# Patient Record
Sex: Male | Born: 1965 | Race: White | Hispanic: No | Marital: Married | Smoking: Never smoker
Health system: Southern US, Community
[De-identification: ages and names within clinical notes are randomized; demographics above are authoritative.]

## PROBLEM LIST (undated history)

## (undated) DIAGNOSIS — M5412 Radiculopathy, cervical region: Secondary | ICD-10-CM

## (undated) DIAGNOSIS — N529 Male erectile dysfunction, unspecified: Secondary | ICD-10-CM

## (undated) DIAGNOSIS — I10 Essential (primary) hypertension: Secondary | ICD-10-CM

## (undated) DIAGNOSIS — M5000 Cervical disc disorder with myelopathy, unspecified cervical region: Secondary | ICD-10-CM

## (undated) DIAGNOSIS — M1990 Unspecified osteoarthritis, unspecified site: Secondary | ICD-10-CM

## (undated) DIAGNOSIS — N138 Other obstructive and reflux uropathy: Secondary | ICD-10-CM

## (undated) DIAGNOSIS — K222 Esophageal obstruction: Secondary | ICD-10-CM

## (undated) DIAGNOSIS — N401 Enlarged prostate with lower urinary tract symptoms: Secondary | ICD-10-CM

## (undated) DIAGNOSIS — N411 Chronic prostatitis: Secondary | ICD-10-CM

## (undated) DIAGNOSIS — R1310 Dysphagia, unspecified: Secondary | ICD-10-CM

## (undated) DIAGNOSIS — F419 Anxiety disorder, unspecified: Secondary | ICD-10-CM

## (undated) DIAGNOSIS — K219 Gastro-esophageal reflux disease without esophagitis: Secondary | ICD-10-CM

## (undated) DIAGNOSIS — U071 COVID-19: Secondary | ICD-10-CM

## (undated) HISTORY — DX: Anxiety disorder, unspecified: F41.9

## (undated) HISTORY — PX: APPENDECTOMY: SHX54

---

## 2006-09-02 LAB — HM COLONOSCOPY

## 2009-02-20 ENCOUNTER — Ambulatory Visit: Payer: Self-pay | Admitting: Unknown Physician Specialty

## 2010-11-26 ENCOUNTER — Ambulatory Visit: Payer: Self-pay | Admitting: Family Medicine

## 2012-09-23 DIAGNOSIS — N401 Enlarged prostate with lower urinary tract symptoms: Secondary | ICD-10-CM | POA: Insufficient documentation

## 2012-09-23 DIAGNOSIS — N411 Chronic prostatitis: Secondary | ICD-10-CM | POA: Insufficient documentation

## 2012-09-23 DIAGNOSIS — N508 Other specified disorders of male genital organs: Secondary | ICD-10-CM | POA: Insufficient documentation

## 2012-09-23 DIAGNOSIS — N529 Male erectile dysfunction, unspecified: Secondary | ICD-10-CM | POA: Insufficient documentation

## 2012-09-23 DIAGNOSIS — D487 Neoplasm of uncertain behavior of other specified sites: Secondary | ICD-10-CM | POA: Insufficient documentation

## 2013-07-26 ENCOUNTER — Ambulatory Visit: Payer: Self-pay | Admitting: Family Medicine

## 2013-09-26 ENCOUNTER — Ambulatory Visit: Payer: Self-pay | Admitting: Surgery

## 2013-09-29 LAB — PATHOLOGY REPORT

## 2014-07-25 NOTE — Op Note (Signed)
PATIENT NAME:  Johnny Liu, Johnny Liu MR#:  [id number] DATE OF BIRTH:  01-06-1966  DATE OF PROCEDURE:  09/26/2013  PREOPERATIVE DIAGNOSIS: Left axillary sebaceous cyst.  POSTOPERATIVE DIAGNOSIS: Left axillary sebaceous cyst, 31 mm.   OPERATION PERFORMED: Excision of left axillary sebaceous cyst.   SURGEON: Molly Maduro, MD  ANESTHESIA: General.   PROCEDURE IN DETAIL: The patient was placed supine on the operating room table and prepped and draped in the usual sterile fashion. An elliptical incision was made around the central pore to include the skin and this was carried down through the subcutaneous tissue to the cyst wall where the skin and cyst were excised en bloc. The excision was performed sharply and hemostasis was then achieved with electrocautery. The dead space was closed by interrupted 3-0 Monocryl sutures, that were also placed to a subdermal layer, and then the skin was reapproximated with a running subcuticular 5-0 Monocryl and Dermabond. The patient tolerated the procedure well. There were no complications.  ____________________________ Consuela Mimes, MD [id number] D: 09/26/2013 10:46:49 ET T: 09/26/2013 11:11:58 ET JOB#: [id number]  cc: Consuela Mimes, MD, <Dictator> Consuela Mimes MD ELECTRONICALLY SIGNED 09/27/2013 12:35

## 2015-03-17 ENCOUNTER — Encounter: Payer: Self-pay | Admitting: Family Medicine

## 2015-03-17 ENCOUNTER — Ambulatory Visit (INDEPENDENT_AMBULATORY_CARE_PROVIDER_SITE_OTHER): Payer: BLUE CROSS/BLUE SHIELD | Admitting: Family Medicine

## 2015-03-17 VITALS — BP 120/80 | HR 80 | Ht 68.0 in | Wt 226.0 lb

## 2015-03-17 DIAGNOSIS — M7711 Lateral epicondylitis, right elbow: Secondary | ICD-10-CM

## 2015-03-17 DIAGNOSIS — M7712 Lateral epicondylitis, left elbow: Secondary | ICD-10-CM | POA: Diagnosis not present

## 2015-03-17 DIAGNOSIS — F419 Anxiety disorder, unspecified: Secondary | ICD-10-CM

## 2015-03-17 DIAGNOSIS — Z0000 Encounter for general adult medical examination without abnormal findings: Secondary | ICD-10-CM

## 2015-03-17 DIAGNOSIS — L821 Other seborrheic keratosis: Secondary | ICD-10-CM | POA: Diagnosis not present

## 2015-03-17 MED ORDER — ALPRAZOLAM 0.25 MG PO TABS: 0.2500 mg | ORAL_TABLET | Freq: Two times a day (BID) | ORAL | Status: DC | PRN

## 2015-03-17 NOTE — Patient Instructions (Signed)
Lateral Epicondylitis With Rehab Lateral epicondylitis involves inflammation and pain around the outer portion of the elbow. The pain is caused by inflammation of the tendons in the forearm that bring back (extend) the wrist. Lateral epicondylitis is also called tennis elbow, because it is very common in tennis players. However, it may occur in any individual who extends the wrist repetitively. If lateral epicondylitis is left untreated, it may become a chronic problem. SYMPTOMS   Pain, tenderness, and inflammation on the outer (lateral) side of the elbow.  Pain or weakness with gripping activities.  Pain that increases with wrist-twisting motions (playing tennis, using a screwdriver, opening a door or a jar).  Pain with lifting objects, including a coffee cup. CAUSES  Lateral epicondylitis is caused by inflammation of the tendons that extend the wrist. Causes of injury may include:  Repetitive stress and strain on the muscles and tendons that extend the wrist.  Sudden change in activity level or intensity.  Incorrect grip in racquet sports.  Incorrect grip size of racquet (often too large).  Incorrect hitting position or technique (usually backhand, leading with the elbow).  Using a racket that is too heavy. RISK INCREASES WITH:  Sports or occupations that require repetitive and/or strenuous forearm and wrist movements (tennis, squash, racquetball, carpentry).  Poor wrist and forearm strength and flexibility.  Failure to warm up properly before activity.  Resuming activity before healing, rehabilitation, and conditioning are complete. PREVENTION   Warm up and stretch properly before activity.  Maintain physical fitness:  Strength, flexibility, and endurance.  Cardiovascular fitness.  Wear and use properly fitted equipment.  Learn and use proper technique and have a coach correct improper technique.  Wear a tennis elbow (counterforce) brace. PROGNOSIS  The course of  this condition depends on the degree of the injury. If treated properly, acute cases (symptoms lasting less than 4 weeks) are often resolved in 2 to 6 weeks. Chronic (longer lasting cases) often resolve in 3 to 6 months but may require physical therapy. RELATED COMPLICATIONS   Frequently recurring symptoms, resulting in a chronic problem. Properly treating the problem the first time decreases frequency of recurrence.  Chronic inflammation, scarring tendon degeneration, and partial tendon tear, requiring surgery.  Delayed healing or resolution of symptoms. TREATMENT  Treatment first involves the use of ice and medicine to reduce pain and inflammation. Strengthening and stretching exercises may help reduce discomfort if performed regularly. These exercises may be performed at home if the condition is an acute injury. Chronic cases may require a referral to a physical therapist for evaluation and treatment. Your caregiver may advise a corticosteroid injection to help reduce inflammation. Rarely, surgery is needed. MEDICATION  If pain medicine is needed, nonsteroidal anti-inflammatory medicines (aspirin and ibuprofen), or other minor pain relievers (acetaminophen), are often advised.  Do not take pain medicine for 7 days before surgery.  Prescription pain relievers may be given, if your caregiver thinks they are needed. Use only as directed and only as much as you need.  Corticosteroid injections may be recommended. These injections should be reserved only for the most severe cases, because they can only be given a certain number of times. HEAT AND COLD  Cold treatment (icing) should be applied for 10 to 15 minutes every 2 to 3 hours for inflammation and pain, and immediately after activity that aggravates your symptoms. Use ice packs or an ice massage.  Heat treatment may be used before performing stretching and strengthening activities prescribed by your caregiver, physical therapist,   or  athletic trainer. Use a heat pack or a warm water soak. SEEK MEDICAL CARE IF: Symptoms get worse or do not improve in 2 weeks, despite treatment. EXERCISES  RANGE OF MOTION (ROM) AND STRETCHING EXERCISES - Epicondylitis, Lateral (Tennis Elbow) These exercises may help you when beginning to rehabilitate your injury. Your symptoms may go away with or without further involvement from your physician, physical therapist, or athletic trainer. While completing these exercises, remember:   Restoring tissue flexibility helps normal motion to return to the joints. This allows healthier, less painful movement and activity.  An effective stretch should be held for at least 30 seconds.  A stretch should never be painful. You should only feel a gentle lengthening or release in the stretched tissue. RANGE OF MOTION - Wrist Flexion, Active-Assisted  Extend your right / left elbow with your fingers pointing down.*  Gently pull the back of your hand towards you, until you feel a gentle stretch on the top of your forearm.  Hold this position for __________ seconds. Repeat __________ times. Complete this exercise __________ times per day.  *If directed by your physician, physical therapist or athletic trainer, complete this stretch with your elbow bent, rather than extended. RANGE OF MOTION - Wrist Extension, Active-Assisted  Extend your right / left elbow and turn your palm upwards.*  Gently pull your palm and fingertips back, so your wrist extends and your fingers point more toward the ground.  You should feel a gentle stretch on the inside of your forearm.  Hold this position for __________ seconds. Repeat __________ times. Complete this exercise __________ times per day. *If directed by your physician, physical therapist or athletic trainer, complete this stretch with your elbow bent, rather than extended. STRETCH - Wrist Flexion  Place the back of your right / left hand on a tabletop, leaving your  elbow slightly bent. Your fingers should point away from your body.  Gently press the back of your hand down onto the table by straightening your elbow. You should feel a stretch on the top of your forearm.  Hold this position for __________ seconds. Repeat __________ times. Complete this stretch __________ times per day.  STRETCH - Wrist Extension   Place your right / left fingertips on a tabletop, leaving your elbow slightly bent. Your fingers should point backwards.  Gently press your fingers and palm down onto the table by straightening your elbow. You should feel a stretch on the inside of your forearm.  Hold this position for __________ seconds. Repeat __________ times. Complete this stretch __________ times per day.  STRENGTHENING EXERCISES - Epicondylitis, Lateral (Tennis Elbow) These exercises may help you when beginning to rehabilitate your injury. They may resolve your symptoms with or without further involvement from your physician, physical therapist, or athletic trainer. While completing these exercises, remember:   Muscles can gain both the endurance and the strength needed for everyday activities through controlled exercises.  Complete these exercises as instructed by your physician, physical therapist or athletic trainer. Increase the resistance and repetitions only as guided.  You may experience muscle soreness or fatigue, but the pain or discomfort you are trying to eliminate should never worsen during these exercises. If this pain does get worse, stop and make sure you are following the directions exactly. If the pain is still present after adjustments, discontinue the exercise until you can discuss the trouble with your caregiver. STRENGTH - Wrist Flexors  Sit with your right / left forearm palm-up and fully supported   on a table or countertop. Your elbow should be resting below the height of your shoulder. Allow your wrist to extend over the edge of the  surface.  Loosely holding a __________ weight, or a piece of rubber exercise band or tubing, slowly curl your hand up toward your forearm.  Hold this position for __________ seconds. Slowly lower the wrist back to the starting position in a controlled manner. Repeat __________ times. Complete this exercise __________ times per day.  STRENGTH - Wrist Extensors  Sit with your right / left forearm palm-down and fully supported on a table or countertop. Your elbow should be resting below the height of your shoulder. Allow your wrist to extend over the edge of the surface.  Loosely holding a __________ weight, or a piece of rubber exercise band or tubing, slowly curl your hand up toward your forearm.  Hold this position for __________ seconds. Slowly lower the wrist back to the starting position in a controlled manner. Repeat __________ times. Complete this exercise __________ times per day.  STRENGTH - Ulnar Deviators  Stand with a ____________________ weight in your right / left hand, or sit while holding a rubber exercise band or tubing, with your healthy arm supported on a table or countertop.  Move your wrist, so that your pinkie travels toward your forearm and your thumb moves away from your forearm.  Hold this position for __________ seconds and then slowly lower the wrist back to the starting position. Repeat __________ times. Complete this exercise __________ times per day STRENGTH - Radial Deviators  Stand with a ____________________ weight in your right / left hand, or sit while holding a rubber exercise band or tubing, with your injured arm supported on a table or countertop.  Raise your hand upward in front of you or pull up on the rubber tubing.  Hold this position for __________ seconds and then slowly lower the wrist back to the starting position. Repeat __________ times. Complete this exercise __________ times per day. STRENGTH - Forearm Supinators   Sit with your right /  left forearm supported on a table, keeping your elbow below shoulder height. Rest your hand over the edge, palm down.  Gently grip a hammer or a soup ladle.  Without moving your elbow, slowly turn your palm and hand upward to a "thumbs-up" position.  Hold this position for __________ seconds. Slowly return to the starting position. Repeat __________ times. Complete this exercise __________ times per day.  STRENGTH - Forearm Pronators   Sit with your right / left forearm supported on a table, keeping your elbow below shoulder height. Rest your hand over the edge, palm up.  Gently grip a hammer or a soup ladle.  Without moving your elbow, slowly turn your palm and hand upward to a "thumbs-up" position.  Hold this position for __________ seconds. Slowly return to the starting position. Repeat __________ times. Complete this exercise __________ times per day.  STRENGTH - Grip  Grasp a tennis ball, a dense sponge, or a large, rolled sock in your hand.  Squeeze as hard as you can, without increasing any pain.  Hold this position for __________ seconds. Release your grip slowly. Repeat __________ times. Complete this exercise __________ times per day.  STRENGTH - Elbow Extensors, Isometric  Stand or sit upright, on a firm surface. Place your right / left arm so that your palm faces your stomach, and it is at the height of your waist.  Place your opposite hand on the underside   of your forearm. Gently push up as your right / left arm resists. Push as hard as you can with both arms, without causing any pain or movement at your right / left elbow. Hold this stationary position for __________ seconds. Gradually release the tension in both arms. Allow your muscles to relax completely before repeating.   This information is not intended to replace advice given to you by your health care provider. Make sure you discuss any questions you have with your health care provider.   Document Released:  03/20/2005 Document Revised: 04/10/2014 Document Reviewed: 07/02/2008 Elsevier Interactive Patient Education 2016 [place] Maintenance, Male A healthy lifestyle and preventative care can promote health and wellness.  Maintain regular health, dental, and eye exams.  Eat a healthy diet. Foods like vegetables, fruits, whole grains, low-fat dairy products, and lean protein foods contain the nutrients you need and are low in calories. Decrease your intake of foods high in solid fats, added sugars, and salt. Get information about a proper diet from your health care provider, if necessary.  Regular physical exercise is one of the most important things you can do for your health. Most adults should get at least 150 minutes of moderate-intensity exercise (any activity that increases your heart rate and causes you to sweat) each week. In addition, most adults need muscle-strengthening exercises on 2 or more days a week.   Maintain a healthy weight. The body mass index (BMI) is a screening tool to identify possible weight problems. It provides an estimate of body fat based on height and weight. Your health care provider can find your BMI and can help you achieve or maintain a healthy weight. For males 20 years and older:  A BMI below 18.5 is considered underweight.  A BMI of 18.5 to 24.9 is normal.  A BMI of 25 to 29.9 is considered overweight.  A BMI of 30 and above is considered obese.  Maintain normal blood lipids and cholesterol by exercising and minimizing your intake of saturated fat. Eat a balanced diet with plenty of fruits and vegetables. Blood tests for lipids and cholesterol should begin at age 32 and be repeated every 5 years. If your lipid or cholesterol levels are high, you are over age 76, or you are at high risk for heart disease, you may need your cholesterol levels checked more frequently.Ongoing high lipid and cholesterol levels should be treated with medicines if diet and  exercise are not working.  If you smoke, find out from your health care provider how to quit. If you do not use tobacco, do not start.  Lung cancer screening is recommended for adults aged 53-80 years who are at high risk for developing lung cancer because of a history of smoking. A yearly low-dose CT scan of the lungs is recommended for people who have at least a 30-pack-year history of smoking and are current smokers or have quit within the past 15 years. A pack year of smoking is smoking an average of 1 pack of cigarettes a day for 1 year (for example, a 30-pack-year history of smoking could mean smoking 1 pack a day for 30 years or 2 packs a day for 15 years). Yearly screening should continue until the smoker has stopped smoking for at least 15 years. Yearly screening should be stopped for people who develop a health problem that would prevent them from having lung cancer treatment.  If you choose to drink alcohol, do not have more than 2 drinks per  day. One drink is considered to be 12 oz (360 mL) of beer, 5 oz (150 mL) of wine, or 1.5 oz (45 mL) of liquor.  Avoid the use of street drugs. Do not share needles with anyone. Ask for help if you need support or instructions about stopping the use of drugs.  High blood pressure causes heart disease and increases the risk of stroke. High blood pressure is more likely to develop in:  People who have blood pressure in the end of the normal range (100-139/85-89 mm Hg).  People who are overweight or obese.  People who are African American.  If you are 3-26 years of age, have your blood pressure checked every 3-5 years. If you are 40 years of age or older, have your blood pressure checked every year. You should have your blood pressure measured twice--once when you are at a hospital or clinic, and once when you are not at a hospital or clinic. Record the average of the two measurements. To check your blood pressure when you are not at a hospital or  clinic, you can use:  An automated blood pressure machine at a pharmacy.  A home blood pressure monitor.  If you are 40-32 years old, ask your health care provider if you should take aspirin to prevent heart disease.  Diabetes screening involves taking a blood sample to check your fasting blood sugar level. This should be done once every 3 years after age 38 if you are at a normal weight and without risk factors for diabetes. Testing should be considered at a younger age or be carried out more frequently if you are overweight and have at least 1 risk factor for diabetes.  Colorectal cancer can be detected and often prevented. Most routine colorectal cancer screening begins at the age of 23 and continues through age 29. However, your health care provider may recommend screening at an earlier age if you have risk factors for colon cancer. On a yearly basis, your health care provider may provide home test kits to check for hidden blood in the stool. A small camera at the end of a tube may be used to directly examine the colon (sigmoidoscopy or colonoscopy) to detect the earliest forms of colorectal cancer. Talk to your health care provider about this at age 63 when routine screening begins. A direct exam of the colon should be repeated every 5-10 years through age 53, unless early forms of precancerous polyps or small growths are found.  People who are at an increased risk for hepatitis B should be screened for this virus. You are considered at high risk for hepatitis B if:  You were born in a country where hepatitis B occurs often. Talk with your health care provider about which countries are considered high risk.  Your parents were born in a high-risk country and you have not received a shot to protect against hepatitis B (hepatitis B vaccine).  You have HIV or AIDS.  You use needles to inject street drugs.  You live with, or have sex with, someone who has hepatitis B.  You are a man who has  sex with other men (MSM).  You get hemodialysis treatment.  You take certain medicines for conditions like cancer, organ transplantation, and autoimmune conditions.  Hepatitis C blood testing is recommended for all people born from [birth year] through 1966 and any individual with known risk factors for hepatitis C.  Healthy men should no longer receive prostate-specific antigen (PSA) blood tests as part  of routine cancer screening. Talk to your health care provider about prostate cancer screening.  Testicular cancer screening is not recommended for adolescents or adult males who have no symptoms. Screening includes self-exam, a health care provider exam, and other screening tests. Consult with your health care provider about any symptoms you have or any concerns you have about testicular cancer.  Practice safe sex. Use condoms and avoid high-risk sexual practices to reduce the spread of sexually transmitted infections (STIs).  You should be screened for STIs, including gonorrhea and chlamydia if:  You are sexually active and are younger than 24 years.  You are older than 24 years, and your health care provider tells you that you are at risk for this type of infection.  Your sexual activity has changed since you were last screened, and you are at an increased risk for chlamydia or gonorrhea. Ask your health care provider if you are at risk.  If you are at risk of being infected with HIV, it is recommended that you take a prescription medicine daily to prevent HIV infection. This is called pre-exposure prophylaxis (PrEP). You are considered at risk if:  You are a man who has sex with other men (MSM).  You are a heterosexual man who is sexually active with multiple partners.  You take drugs by injection.  You are sexually active with a partner who has HIV.  Talk with your health care provider about whether you are at high risk of being infected with HIV. If you choose to begin PrEP, you should  first be tested for HIV. You should then be tested every 3 months for as long as you are taking PrEP.  Use sunscreen. Apply sunscreen liberally and repeatedly throughout the day. You should seek shade when your shadow is shorter than you. Protect yourself by wearing long sleeves, pants, a wide-brimmed hat, and sunglasses year round whenever you are outdoors.  Tell your health care provider of new moles or changes in moles, especially if there is a change in shape or color. Also, tell your health care provider if a mole is larger than the size of a pencil eraser.  A one-time screening for abdominal aortic aneurysm (AAA) and surgical repair of large AAAs by ultrasound is recommended for men aged 37-75 years who are current or former smokers.  Stay current with your vaccines (immunizations).   This information is not intended to replace advice given to you by your health care provider. Make sure you discuss any questions you have with your health care provider.   Document Released: 09/16/2007 Document Revised: 04/10/2014 Document Reviewed: 08/15/2010 Elsevier Interactive Patient Education [employer].

## 2015-03-17 NOTE — Progress Notes (Signed)
Name: Johnny Liu   MRN: [id number]    DOB: 04-09-1965   Date:03/17/2015       Progress Note  Subjective  Chief Complaint  Chief Complaint  Patient presents with  . Establish Care    Anxiety Presents for follow-up visit. Symptoms include nervous/anxious behavior. Patient reports no chest pain, compulsions, confusion, decreased concentration, depressed mood, dizziness, dry mouth, excessive worry, feeling of choking, hyperventilation, impotence, insomnia, irritability, malaise, muscle tension, nausea, obsessions, palpitations, panic, restlessness, shortness of breath or suicidal ideas. Symptoms occur occasionally. The severity of symptoms is mild. The symptoms are aggravated by work stress and family issues. The quality of sleep is good.   There is no history of anemia, anxiety/panic attacks, arrhythmia, asthma, bipolar disorder, CAD, CHF, chronic lung disease, depression, fibromyalgia, hyperthyroidism or suicide attempts. Past treatments include benzodiazephines. Compliance with prior treatments has been good.    No problem-specific assessment & plan notes found for this encounter.   No past medical history on file.  Past Surgical History  Procedure Laterality Date  . Appendectomy      No family history on file.  Social History   Social History  . Marital Status: Married    Spouse Name: N/A  . Number of Children: N/A  . Years of Education: N/A   Occupational History  . Not on file.   Social History Main Topics  . Smoking status: Never Smoker   . Smokeless tobacco: Not on file  . Alcohol Use: 0.0 oz/week    0 Standard drinks or equivalent per week  . Drug Use: No  . Sexual Activity: Not on file   Other Topics Concern  . Not on file   Social History Narrative  . No narrative on file    No Known Allergies   Review of Systems  Constitutional: Negative for fever, chills, weight loss, malaise/fatigue and irritability.  HENT: Negative for ear discharge, ear pain  and sore throat.   Eyes: Negative for blurred vision.  Respiratory: Negative for cough, sputum production, shortness of breath and wheezing.   Cardiovascular: Negative for chest pain, palpitations and leg swelling.  Gastrointestinal: Negative for heartburn, nausea, abdominal pain, diarrhea, constipation, blood in stool and melena.  Genitourinary: Negative for dysuria, urgency, frequency, hematuria and impotence.  Musculoskeletal: Negative for myalgias, back pain, joint pain and neck pain.  Skin: Negative for rash.  Neurological: Negative for dizziness, tingling, sensory change, focal weakness and headaches.  Endo/Heme/Allergies: Negative for environmental allergies and polydipsia. Does not bruise/bleed easily.  Psychiatric/Behavioral: Negative for depression, suicidal ideas, confusion and decreased concentration. The patient is nervous/anxious. The patient does not have insomnia.      Objective  Filed Vitals:   03/17/15 1019  BP: 120/80  Pulse: 80  Height: 5\' 8"  (1.727 m)  Weight: 226 lb (102.513 kg)    Physical Exam  Constitutional: He is oriented to person, place, and time and well-developed, well-nourished, and in no distress.  HENT:  Head: Normocephalic.  Right Ear: External ear normal.  Left Ear: External ear normal.  Nose: Nose normal.  Mouth/Throat: Oropharynx is clear and moist.  Eyes: Conjunctivae and EOM are normal. Pupils are equal, round, and reactive to light. Right eye exhibits no discharge. Left eye exhibits no discharge. No scleral icterus.  Neck: Normal range of motion. Neck supple. No JVD present. No tracheal deviation present. No thyromegaly present.  Cardiovascular: Normal rate, regular rhythm, normal heart sounds and intact distal pulses.  Exam reveals no gallop and no friction rub.  No murmur heard. Pulmonary/Chest: Breath sounds normal. No respiratory distress. He has no wheezes. He has no rales.  Abdominal: Soft. Bowel sounds are normal. He exhibits no  mass. There is no hepatosplenomegaly. There is no tenderness. There is no rebound, no guarding and no CVA tenderness.  Musculoskeletal: Normal range of motion. He exhibits no edema or tenderness.  Lymphadenopathy:    He has no cervical adenopathy.  Neurological: He is alert and oriented to person, place, and time. He has normal sensation, normal strength and intact cranial nerves. No cranial nerve deficit.  Skin: Skin is warm. No rash noted.  Psychiatric: Mood and affect normal.  Nursing note and vitals reviewed.     Assessment & Plan  Problem List Items Addressed This Visit    None    Visit Diagnoses    Routine adult health maintenance    -  Primary    Relevant Orders    Lipid Profile    Renal Function Panel    Chronic anxiety        Relevant Medications    ALPRAZolam (XANAX) 0.25 MG tablet    Lateral epicondylitis of both elbows        Relevant Orders    Ambulatory referral to Gastroenterology    Seborrheic keratosis        Relevant Orders    Ambulatory referral to Dermatology         Dr. Otilio Miu [facility]  03/17/2015

## 2015-03-18 LAB — RENAL FUNCTION PANEL
ALBUMIN: 5.1 g/dL (ref 3.5–5.5)
BUN/Creatinine Ratio: 16 (ref 9–20)
BUN: 15 mg/dL (ref 6–24)
CHLORIDE: 98 mmol/L (ref 96–106)
CO2: 25 mmol/L (ref 18–29)
Calcium: 9.3 mg/dL (ref 8.7–10.2)
Creatinine, Ser: 0.93 mg/dL (ref 0.76–1.27)
GFR calc Af Amer: 110 mL/min/{1.73_m2} (ref >59)
GFR, EST NON AFRICAN AMERICAN: 95 mL/min/{1.73_m2} (ref >59)
Glucose: 84 mg/dL (ref 65–99)
PHOSPHORUS: 3.6 mg/dL (ref 2.5–4.5)
Potassium: 4.3 mmol/L (ref 3.5–5.2)
SODIUM: 139 mmol/L (ref 134–144)

## 2015-03-18 LAB — LIPID PANEL
Chol/HDL Ratio: 2.8 ratio units (ref 0.0–5.0)
Cholesterol, Total: 170 mg/dL (ref 100–199)
HDL: 60 mg/dL (ref >39)
LDL Calculated: 95 mg/dL (ref 0–99)
Triglycerides: 75 mg/dL (ref 0–149)
VLDL Cholesterol Cal: 15 mg/dL (ref 5–40)

## 2015-07-12 ENCOUNTER — Ambulatory Visit (INDEPENDENT_AMBULATORY_CARE_PROVIDER_SITE_OTHER): Payer: BLUE CROSS/BLUE SHIELD | Admitting: Family Medicine

## 2015-07-12 ENCOUNTER — Encounter: Payer: Self-pay | Admitting: Family Medicine

## 2015-07-12 VITALS — BP 120/80 | HR 78 | Ht 66.0 in | Wt 230.0 lb

## 2015-07-12 DIAGNOSIS — J40 Bronchitis, not specified as acute or chronic: Secondary | ICD-10-CM

## 2015-07-12 DIAGNOSIS — F419 Anxiety disorder, unspecified: Secondary | ICD-10-CM

## 2015-07-12 DIAGNOSIS — J0100 Acute maxillary sinusitis, unspecified: Secondary | ICD-10-CM

## 2015-07-12 MED ORDER — ALPRAZOLAM 0.25 MG PO TABS: 0.2500 mg | ORAL_TABLET | Freq: Two times a day (BID) | ORAL | Status: DC | PRN

## 2015-07-12 MED ORDER — AZITHROMYCIN 250 MG PO TABS: ORAL_TABLET | ORAL | Status: DC

## 2015-07-12 MED ORDER — GUAIFENESIN-CODEINE 100-10 MG/5ML PO SYRP: 5.0000 mL | ORAL_SOLUTION | Freq: Three times a day (TID) | ORAL | Status: DC | PRN

## 2015-07-12 NOTE — Progress Notes (Signed)
Name: Johnny Liu   MRN: [id number]    DOB: 16-Nov-1965   Date:07/12/2015       Progress Note  Subjective  Chief Complaint  Chief Complaint  Patient presents with  . Sinusitis    cough, head cong- "not that bad, don't want it to get worse"  . Anxiety    Sinusitis This is a new problem. The current episode started in the past 7 days. The problem has been waxing and waning since onset. There has been no fever. The pain is mild. Associated symptoms include congestion, coughing, shortness of breath, sinus pressure and a sore throat. Pertinent negatives include no chills, ear pain, headaches, hoarse voice, neck pain, sneezing or swollen glands. (Productive cough) Past treatments include oral decongestants. The treatment provided mild relief.  Anxiety Presents for follow-up visit. Symptoms include shortness of breath. Patient reports no chest pain, compulsions, confusion, decreased concentration, depressed mood, dizziness, dry mouth, excessive worry, feeling of choking, hyperventilation, impotence, insomnia, irritability, malaise, muscle tension, nausea, nervous/anxious behavior, obsessions, palpitations, panic, restlessness or suicidal ideas. Symptoms occur occasionally. The symptoms are aggravated by work stress. The quality of sleep is poor.      No problem-specific assessment & plan notes found for this encounter.   History reviewed. No pertinent past medical history.  Past Surgical History  Procedure Laterality Date  . Appendectomy      History reviewed. No pertinent family history.  Social History   Social History  . Marital Status: Married    Spouse Name: N/A  . Number of Children: N/A  . Years of Education: N/A   Occupational History  . Not on file.   Social History Main Topics  . Smoking status: Never Smoker   . Smokeless tobacco: Not on file  . Alcohol Use: 0.0 oz/week    0 Standard drinks or equivalent per week  . Drug Use: No  . Sexual Activity: Not on file    Other Topics Concern  . Not on file   Social History Narrative    No Known Allergies   Review of Systems  Constitutional: Negative for fever, chills, weight loss, malaise/fatigue and irritability.  HENT: Positive for congestion, sinus pressure and sore throat. Negative for ear discharge, ear pain, hoarse voice and sneezing.   Eyes: Negative for blurred vision.  Respiratory: Positive for cough and shortness of breath. Negative for sputum production and wheezing.   Cardiovascular: Negative for chest pain, palpitations and leg swelling.  Gastrointestinal: Negative for heartburn, nausea, abdominal pain, diarrhea, constipation, blood in stool and melena.  Genitourinary: Negative for dysuria, urgency, frequency, hematuria and impotence.  Musculoskeletal: Negative for myalgias, back pain, joint pain and neck pain.  Skin: Negative for rash.  Neurological: Negative for dizziness, tingling, sensory change, focal weakness and headaches.  Endo/Heme/Allergies: Negative for environmental allergies and polydipsia. Does not bruise/bleed easily.  Psychiatric/Behavioral: Negative for depression, suicidal ideas, confusion and decreased concentration. The patient is not nervous/anxious and does not have insomnia.      Objective  Filed Vitals:   07/12/15 1050  BP: 120/80  Pulse: 78  Height: 5\' 6"  (1.676 m)  Weight: 230 lb (104.327 kg)    Physical Exam  Constitutional: He is oriented to person, place, and time and well-developed, well-nourished, and in no distress.  HENT:  Head: Normocephalic.  Right Ear: Tympanic membrane, external ear and ear canal normal.  Left Ear: Tympanic membrane, external ear and ear canal normal.  Nose: Nose normal.  Mouth/Throat: Mucous membranes are normal. Posterior  oropharyngeal erythema present.  Eyes: Conjunctivae and EOM are normal. Pupils are equal, round, and reactive to light. Right eye exhibits no discharge. Left eye exhibits no discharge. No scleral  icterus.  Neck: Normal range of motion. Neck supple. No JVD present. No tracheal deviation present. No thyromegaly present.  Cardiovascular: Normal rate, regular rhythm, normal heart sounds and intact distal pulses.  Exam reveals no gallop and no friction rub.   No murmur heard. Pulmonary/Chest: Breath sounds normal. No respiratory distress. He has no wheezes. He has no rales.  Abdominal: Soft. Bowel sounds are normal. He exhibits no mass. There is no hepatosplenomegaly. There is no tenderness. There is no rebound, no guarding and no CVA tenderness.  Musculoskeletal: Normal range of motion. He exhibits no edema or tenderness.  Lymphadenopathy:    He has no cervical adenopathy.  Neurological: He is alert and oriented to person, place, and time. He has normal sensation, normal strength and intact cranial nerves. No cranial nerve deficit.  Skin: Skin is warm. No rash noted.  Psychiatric: Mood and affect normal.  Nursing note and vitals reviewed.     Assessment & Plan  Problem List Items Addressed This Visit    None    Visit Diagnoses    Chronic anxiety    -  Primary    Relevant Medications    ALPRAZolam (XANAX) 0.25 MG tablet    Acute maxillary sinusitis, recurrence not specified        Relevant Medications    azithromycin (ZITHROMAX) 250 MG tablet    guaiFENesin-codeine (ROBITUSSIN AC) 100-10 MG/5ML syrup    Bronchitis        Relevant Medications    guaiFENesin-codeine (ROBITUSSIN AC) 100-10 MG/5ML syrup         Dr. Laverne Hursey [facility]  07/12/2015

## 2015-12-21 ENCOUNTER — Encounter: Payer: Self-pay | Admitting: Family Medicine

## 2015-12-21 ENCOUNTER — Other Ambulatory Visit: Payer: Self-pay

## 2015-12-21 ENCOUNTER — Ambulatory Visit (INDEPENDENT_AMBULATORY_CARE_PROVIDER_SITE_OTHER): Payer: BLUE CROSS/BLUE SHIELD | Admitting: Family Medicine

## 2015-12-21 VITALS — BP 130/80 | HR 72 | Ht 68.0 in | Wt 230.0 lb

## 2015-12-21 DIAGNOSIS — M7711 Lateral epicondylitis, right elbow: Secondary | ICD-10-CM | POA: Diagnosis not present

## 2015-12-21 DIAGNOSIS — M7712 Lateral epicondylitis, left elbow: Secondary | ICD-10-CM

## 2015-12-21 DIAGNOSIS — F419 Anxiety disorder, unspecified: Secondary | ICD-10-CM

## 2015-12-21 DIAGNOSIS — Z0000 Encounter for general adult medical examination without abnormal findings: Secondary | ICD-10-CM | POA: Diagnosis not present

## 2015-12-21 LAB — POCT URINALYSIS DIPSTICK
Bilirubin, UA: NEGATIVE
Blood, UA: NEGATIVE
GLUCOSE UA: NEGATIVE
Ketones, UA: NEGATIVE
LEUKOCYTES UA: NEGATIVE
Nitrite, UA: NEGATIVE
PROTEIN UA: NEGATIVE
Spec Grav, UA: <=1.005
UROBILINOGEN UA: 0.2
pH, UA: 6

## 2015-12-21 LAB — HEMOCCULT GUIAC POC 1CARD (OFFICE): FECAL OCCULT BLD: POSITIVE — AB

## 2015-12-21 MED ORDER — ETODOLAC 500 MG PO TABS: 500.0000 mg | ORAL_TABLET | Freq: Two times a day (BID) | ORAL | 3 refills | Status: DC

## 2015-12-21 MED ORDER — BUSPIRONE HCL 7.5 MG PO TABS: 7.5000 mg | ORAL_TABLET | Freq: Two times a day (BID) | ORAL | 2 refills | Status: DC

## 2015-12-21 NOTE — Progress Notes (Signed)
Name: Johnny Liu   MRN: [id number]    DOB: 1966/03/10   Date:12/21/2015       Progress Note  Subjective  Chief Complaint  Chief Complaint  Patient presents with  . Annual Exam    work physical  . Arm Pain    bilateral elbow pain  . Depression    starting a medication for long term use    Patient presents for annual physical exam.   Arm Pain   The incident occurred more than 1 week ago. The injury mechanism was repetitive motion. The pain is present in the left elbow and right elbow. The quality of the pain is described as aching. The pain does not radiate. The pain is at a severity of 5/10. The pain is mild. The pain has been fluctuating since the incident. Pertinent negatives include no chest pain, muscle weakness, numbness or tingling. Nothing aggravates the symptoms.  Depression         This is a new problem.  The current episode started more than 1 month ago.   The onset quality is gradual.   Associated symptoms include no decreased concentration, no fatigue, no helplessness, no hopelessness, does not have insomnia, not irritable, no restlessness, no decreased interest, no appetite change, no body aches, no myalgias, no headaches, no indigestion, not sad and no suicidal ideas.     The symptoms are aggravated by nothing.  Past treatments include nothing.  Previous treatment provided mild relief.   No problem-specific Assessment & Plan notes found for this encounter.   Past Medical History:  Diagnosis Date  . Anxiety     Past Surgical History:  Procedure Laterality Date  . APPENDECTOMY      History reviewed. No pertinent family history.  Social History   Social History  . Marital status: Married    Spouse name: N/A  . Number of children: N/A  . Years of education: N/A   Occupational History  . Not on file.   Social History Main Topics  . Smoking status: Never Smoker  . Smokeless tobacco: Not on file  . Alcohol use 0.0 oz/week  . Drug use: No  . Sexual  activity: Not on file   Other Topics Concern  . Not on file   Social History Narrative  . No narrative on file    No Known Allergies   Review of Systems  Constitutional: Negative for appetite change, chills, fatigue, fever, malaise/fatigue and weight loss.  HENT: Negative for ear discharge, ear pain and sore throat.   Eyes: Negative for blurred vision.  Respiratory: Negative for cough, sputum production, shortness of breath and wheezing.   Cardiovascular: Negative for chest pain, palpitations and leg swelling.  Gastrointestinal: Negative for abdominal pain, blood in stool, constipation, diarrhea, heartburn, melena and nausea.  Genitourinary: Negative for dysuria, frequency, hematuria and urgency.  Musculoskeletal: Negative for back pain, joint pain, myalgias and neck pain.  Skin: Negative for rash.  Neurological: Negative for dizziness, tingling, sensory change, focal weakness, numbness and headaches.  Endo/Heme/Allergies: Negative for environmental allergies and polydipsia. Does not bruise/bleed easily.  Psychiatric/Behavioral: Positive for depression. Negative for decreased concentration and suicidal ideas. The patient is not nervous/anxious and does not have insomnia.      Objective  Vitals:   12/21/15 1400  BP: 130/80  Pulse: 72  Weight: 230 lb (104.3 kg)  Height: 5\' 8"  (1.727 m)    Physical Exam  Constitutional: He is oriented to person, place, and time and well-developed, well-nourished,  and in no distress. He is not irritable.  HENT:  Head: Normocephalic.  Right Ear: Tympanic membrane, external ear and ear canal normal.  Left Ear: Tympanic membrane, external ear and ear canal normal.  Nose: Nose normal.  Mouth/Throat: Uvula is midline and oropharynx is clear and moist.  Eyes: Conjunctivae and EOM are normal. Pupils are equal, round, and reactive to light. Right eye exhibits no discharge. Left eye exhibits no discharge. No scleral icterus.  Fundoscopic exam:       The right eye shows no arteriolar narrowing, no AV nicking and no papilledema.       The left eye shows no arteriolar narrowing, no AV nicking and no papilledema.  Neck: Normal range of motion. Neck supple. Normal carotid pulses, no hepatojugular reflux and no JVD present. Carotid bruit is not present. No tracheal deviation present. No thyromegaly present.  Cardiovascular: Normal rate, regular rhythm, S1 normal, S2 normal, normal heart sounds and intact distal pulses.  Exam reveals no gallop, no S3, no S4 and no friction rub.   No murmur heard. Pulmonary/Chest: Breath sounds normal. No respiratory distress. He has no wheezes. He has no rales. Right breast exhibits no mass. Left breast exhibits no mass.  Abdominal: Soft. Bowel sounds are normal. He exhibits no mass. There is no hepatosplenomegaly. There is no tenderness. There is no rebound, no guarding and no CVA tenderness.  Genitourinary: Rectum normal, prostate normal and testes/scrotum normal.  Musculoskeletal: Normal range of motion. He exhibits no edema.       Right elbow: Tenderness found. Lateral epicondyle tenderness noted.       Left elbow: Tenderness found. Lateral epicondyle tenderness noted.  Lymphadenopathy:       Head (right side): No submental and no submandibular adenopathy present.       Head (left side): No submental and no submandibular adenopathy present.    He has no cervical adenopathy.    He has no axillary adenopathy.  Neurological: He is alert and oriented to person, place, and time. He has normal sensation, normal strength, normal reflexes and intact cranial nerves. No cranial nerve deficit.  Skin: Skin is warm, dry and intact. No rash noted. He is not diaphoretic. No pallor.  Psychiatric: Mood and affect normal.      Assessment & Plan  Problem List Items Addressed This Visit    None    Visit Diagnoses    Annual physical exam    -  Primary   Relevant Orders   Lipid Profile   Renal Function Panel   POCT  Occult Blood Stool (Completed)   POCT urinalysis dipstick (Completed)   Lateral epicondylitis of both elbows       Relevant Medications   etodolac (LODINE) 500 MG tablet   Acute anxiety       Relevant Medications   busPIRone (BUSPAR) 7.5 MG tablet        Dr. Deanna Jones [facility]  12/21/15

## 2015-12-22 LAB — RENAL FUNCTION PANEL
ALBUMIN: 5 g/dL (ref 3.5–5.5)
BUN / CREAT RATIO: 14 (ref 9–20)
BUN: 14 mg/dL (ref 6–24)
CALCIUM: 9.7 mg/dL (ref 8.7–10.2)
CHLORIDE: 96 mmol/L (ref 96–106)
CO2: 25 mmol/L (ref 18–29)
CREATININE: 0.97 mg/dL (ref 0.76–1.27)
GFR calc Af Amer: 105 mL/min/{1.73_m2} (ref >59)
GFR calc non Af Amer: 91 mL/min/{1.73_m2} (ref >59)
Glucose: 98 mg/dL (ref 65–99)
PHOSPHORUS: 4 mg/dL (ref 2.5–4.5)
Potassium: 4.3 mmol/L (ref 3.5–5.2)
Sodium: 140 mmol/L (ref 134–144)

## 2015-12-22 LAB — LIPID PANEL
CHOLESTEROL TOTAL: 168 mg/dL (ref 100–199)
Chol/HDL Ratio: 3.2 ratio units (ref 0.0–5.0)
HDL: 52 mg/dL (ref >39)
LDL CALC: 101 mg/dL — AB (ref 0–99)
TRIGLYCERIDES: 74 mg/dL (ref 0–149)
VLDL Cholesterol Cal: 15 mg/dL (ref 5–40)

## 2016-04-03 HISTORY — PX: POLYPECTOMY: SHX149

## 2016-05-26 ENCOUNTER — Other Ambulatory Visit: Payer: Self-pay

## 2016-05-26 ENCOUNTER — Ambulatory Visit (INDEPENDENT_AMBULATORY_CARE_PROVIDER_SITE_OTHER): Payer: BLUE CROSS/BLUE SHIELD | Admitting: Family Medicine

## 2016-05-26 VITALS — BP 110/70 | HR 80 | Ht 68.0 in | Wt 225.0 lb

## 2016-05-26 DIAGNOSIS — M5126 Other intervertebral disc displacement, lumbar region: Secondary | ICD-10-CM | POA: Diagnosis not present

## 2016-05-26 MED ORDER — TRAMADOL HCL 50 MG PO TABS: 50.0000 mg | ORAL_TABLET | Freq: Three times a day (TID) | ORAL | 0 refills | Status: DC | PRN

## 2016-05-26 MED ORDER — PREDNISONE 10 MG PO TABS: 10.0000 mg | ORAL_TABLET | Freq: Every day | ORAL | 0 refills | Status: DC

## 2016-05-26 MED ORDER — CYCLOBENZAPRINE HCL 10 MG PO TABS: 10.0000 mg | ORAL_TABLET | Freq: Three times a day (TID) | ORAL | 1 refills | Status: DC | PRN

## 2016-05-26 MED ORDER — ETODOLAC 500 MG PO TABS: 500.0000 mg | ORAL_TABLET | Freq: Two times a day (BID) | ORAL | 1 refills | Status: DC

## 2016-05-26 NOTE — Progress Notes (Signed)
Name: Johnny Liu   MRN: [id number]    DOB: 1966-03-17   Date:05/26/2016       Progress Note  Subjective  Chief Complaint  Chief Complaint  Patient presents with  . Back Pain    muscle spasms after running- wants Tramadol    Back Pain  This is a new problem. The current episode started in the past 7 days. The problem occurs intermittently. The problem has been waxing and waning since onset. The pain is present in the lumbar spine (across at the bottom). The quality of the pain is described as aching. The pain does not radiate. The pain is at a severity of 8/10. The pain is moderate. The symptoms are aggravated by bending, standing, twisting and sitting. Pertinent negatives include no abdominal pain, bladder incontinence, bowel incontinence, chest pain, dysuria, fever, headaches, leg pain, numbness, paresis, paresthesias, perianal numbness, tingling, weakness or weight loss. Risk factors include recent trauma (started running program). The treatment provided moderate relief.    No problem-specific Assessment & Plan notes found for this encounter.   Past Medical History:  Diagnosis Date  . Anxiety     Past Surgical History:  Procedure Laterality Date  . APPENDECTOMY      No family history on file.  Social History   Social History  . Marital status: Married    Spouse name: N/A  . Number of children: N/A  . Years of education: N/A   Occupational History  . Not on file.   Social History Main Topics  . Smoking status: Never Smoker  . Smokeless tobacco: Not on file  . Alcohol use 0.0 oz/week  . Drug use: No  . Sexual activity: Not on file   Other Topics Concern  . Not on file   Social History Narrative  . No narrative on file    No Known Allergies  Outpatient Medications Prior to Visit  Medication Sig Dispense Refill  . ALPRAZolam (XANAX) 0.25 MG tablet Take 1 tablet (0.25 mg total) by mouth 2 (two) times daily as needed for anxiety. Take one a day prn (Patient  taking differently: Take 0.25 mg by mouth at bedtime. Take one a day prn) 30 tablet 5  . busPIRone (BUSPAR) 7.5 MG tablet Take 1 tablet (7.5 mg total) by mouth 2 (two) times daily. (Patient not taking: Reported on 05/26/2016) 60 tablet 2  . etodolac (LODINE) 500 MG tablet Take 1 tablet (500 mg total) by mouth 2 (two) times daily. (Patient not taking: Reported on 05/26/2016) 60 tablet 3  . azithromycin (ZITHROMAX) 250 MG tablet 2 today then one a day for 4 days (Patient not taking: Reported on 12/21/2015) 6 tablet 0  . guaiFENesin-codeine (ROBITUSSIN AC) 100-10 MG/5ML syrup Take 5 mLs by mouth 3 (three) times daily as needed for cough. (Patient not taking: Reported on 12/21/2015) 150 mL 0   No facility-administered medications prior to visit.     Review of Systems  Constitutional: Negative for chills, fever, malaise/fatigue and weight loss.  HENT: Negative for ear discharge, ear pain and sore throat.   Eyes: Negative for blurred vision.  Respiratory: Negative for cough, sputum production, shortness of breath and wheezing.   Cardiovascular: Negative for chest pain, palpitations and leg swelling.  Gastrointestinal: Negative for abdominal pain, blood in stool, bowel incontinence, constipation, diarrhea, heartburn, melena and nausea.  Genitourinary: Negative for bladder incontinence, dysuria, frequency, hematuria and urgency.  Musculoskeletal: Positive for back pain. Negative for joint pain, myalgias and neck pain.  Skin:  Negative for rash.  Neurological: Negative for dizziness, tingling, sensory change, focal weakness, weakness, numbness, headaches and paresthesias.  Endo/Heme/Allergies: Negative for environmental allergies and polydipsia. Does not bruise/bleed easily.  Psychiatric/Behavioral: Negative for depression and suicidal ideas. The patient is not nervous/anxious and does not have insomnia.      Objective  Vitals:   05/26/16 0946  BP: 110/70  Pulse: 80  Weight: 225 lb (102.1 kg)   Height: 5\' 8"  (1.727 m)    Physical Exam  Constitutional: He is oriented to person, place, and time and well-developed, well-nourished, and in no distress.  HENT:  Head: Normocephalic.  Right Ear: External ear normal.  Left Ear: External ear normal.  Nose: Nose normal.  Mouth/Throat: Oropharynx is clear and moist.  Eyes: Conjunctivae and EOM are normal. Pupils are equal, round, and reactive to light. Right eye exhibits no discharge. Left eye exhibits no discharge. No scleral icterus.  Neck: Normal range of motion. Neck supple. No JVD present. No tracheal deviation present. No thyromegaly present.  Cardiovascular: Normal rate, regular rhythm, normal heart sounds and intact distal pulses.  Exam reveals no gallop and no friction rub.   No murmur heard. Pulmonary/Chest: Breath sounds normal. No respiratory distress. He has no wheezes. He has no rales.  Abdominal: Soft. Bowel sounds are normal. He exhibits no mass. There is no hepatosplenomegaly. There is no tenderness. There is no rebound, no guarding and no CVA tenderness.  Musculoskeletal: Normal range of motion. He exhibits no edema or tenderness.       Lumbar back: He exhibits spasm. He exhibits no tenderness and no bony tenderness.  Lymphadenopathy:    He has no cervical adenopathy.  Neurological: He is alert and oriented to person, place, and time. He has normal sensation, normal strength, normal reflexes and intact cranial nerves. No cranial nerve deficit.  Skin: Skin is warm. No rash noted.  Psychiatric: Mood and affect normal.  Nursing note and vitals reviewed.     Assessment & Plan  Problem List Items Addressed This Visit    None    Visit Diagnoses    Lumbar disc herniation    -  Primary   Relevant Medications   cyclobenzaprine (FLEXERIL) 10 MG tablet   etodolac (LODINE) 500 MG tablet   traMADol (ULTRAM) 50 MG tablet   predniSONE (DELTASONE) 10 MG tablet      Meds ordered this encounter  Medications  .  cyclobenzaprine (FLEXERIL) 10 MG tablet    Sig: Take 1 tablet (10 mg total) by mouth 3 (three) times daily as needed for muscle spasms.    Dispense:  30 tablet    Refill:  1  . etodolac (LODINE) 500 MG tablet    Sig: Take 1 tablet (500 mg total) by mouth 2 (two) times daily.    Dispense:  60 tablet    Refill:  1  . traMADol (ULTRAM) 50 MG tablet    Sig: Take 1 tablet (50 mg total) by mouth every 8 (eight) hours as needed.    Dispense:  30 tablet    Refill:  0  . predniSONE (DELTASONE) 10 MG tablet    Sig: Take 1 tablet (10 mg total) by mouth daily with breakfast.    Dispense:  30 tablet    Refill:  0      Dr. Roselina Burgueno [facility]  05/26/16

## 2016-07-07 ENCOUNTER — Ambulatory Visit (INDEPENDENT_AMBULATORY_CARE_PROVIDER_SITE_OTHER): Payer: BLUE CROSS/BLUE SHIELD | Admitting: Family Medicine

## 2016-07-07 ENCOUNTER — Encounter: Payer: Self-pay | Admitting: Family Medicine

## 2016-07-07 VITALS — BP 120/80 | HR 78 | Ht 68.0 in | Wt 223.0 lb

## 2016-07-07 DIAGNOSIS — R1084 Generalized abdominal pain: Secondary | ICD-10-CM

## 2016-07-07 DIAGNOSIS — K580 Irritable bowel syndrome with diarrhea: Secondary | ICD-10-CM

## 2016-07-07 DIAGNOSIS — Z1211 Encounter for screening for malignant neoplasm of colon: Secondary | ICD-10-CM | POA: Diagnosis not present

## 2016-07-07 LAB — HEMOCCULT GUIAC POC 1CARD (OFFICE): Fecal Occult Blood, POC: NEGATIVE

## 2016-07-07 MED ORDER — PANTOPRAZOLE SODIUM 40 MG PO TBEC: 40.0000 mg | DELAYED_RELEASE_TABLET | Freq: Every day | ORAL | 3 refills | Status: DC

## 2016-07-07 NOTE — Patient Instructions (Addendum)
Irritable Bowel Syndrome, Adult Irritable bowel syndrome (IBS) is not one specific disease. It is a group of symptoms that affects the organs responsible for digestion (gastrointestinal or GI tract). To regulate how your GI tract works, your body sends signals back and forth between your intestines and your brain. If you have IBS, there may be a problem with these signals. As a result, your GI tract does not function normally. Your intestines may become more sensitive and overreact to certain things. This is especially true when you eat certain foods or when you are under stress. There are four types of IBS. These may be determined based on the consistency of your stool:  IBS with diarrhea.  IBS with constipation.  Mixed IBS.  Unsubtyped IBS. It is important to know which type of IBS you have. Some treatments are more likely to be helpful for certain types of IBS. What are the causes? The exact cause of IBS is not known. What increases the risk? You may have a higher risk of IBS if:  You are a woman.  You are younger than 52 years old.  You have a family history of IBS.  You have mental health problems.  You have had bacterial infection of your GI tract. What are the signs or symptoms? Symptoms of IBS vary from person to person. The main symptom is abdominal pain or discomfort. Additional symptoms usually include one or more of the following:  Diarrhea, constipation, or both.  Abdominal swelling or bloating.  Feeling full or sick after eating a small or regular-size meal.  Frequent gas.  Mucus in the stool.  A feeling of having more stool left after a bowel movement. Symptoms tend to come and go. They may be associated with stress, psychiatric conditions, or nothing at all. How is this diagnosed? There is no specific test to diagnose IBS. Your health care provider will make a diagnosis based on a physical exam, medical history, and your symptoms. You may have other tests to  rule out other conditions that may be causing your symptoms. These may include:  Blood tests.  X-rays.  CT scan.  Endoscopy and colonoscopy. This is a test in which your GI tract is viewed with a long, thin, flexible tube. How is this treated? There is no cure for IBS, but treatment can help relieve symptoms. IBS treatment often includes:  Changes to your diet, such as:  Eating more fiber.  Avoiding foods that cause symptoms.  Drinking more water.  Eating regular, medium-sized portioned meals.  Medicines. These may include:  Fiber supplements if you have constipation.  Medicine to control diarrhea (antidiarrheal medicines).  Medicine to help control muscle spasms in your GI tract (antispasmodic medicines).  Medicines to help with any mental health issues, such as antidepressants or tranquilizers.  Therapy.  Talk therapy may help with anxiety, depression, or other mental health issues that can make IBS symptoms worse.  Stress reduction.  Managing your stress can help keep symptoms under control. Follow these instructions at home:  Take medicines only as directed by your health care provider.  Eat a healthy diet.  Avoid foods and drinks with added sugar.  Include more whole grains, fruits, and vegetables gradually into your diet. This may be especially helpful if you have IBS with constipation.  Avoid any foods and drinks that make your symptoms worse. These may include dairy products and caffeinated or carbonated drinks.  Do not eat large meals.  Drink enough fluid to keep your urine   clear or pale yellow.  Exercise regularly. Ask your health care provider for recommendations of good activities for you.  Keep all follow-up visits as directed by your health care provider. This is important. Contact a health care provider if:  You have constant pain.  You have trouble or pain with swallowing.  You have worsening diarrhea. Get help right away if:  You  have severe and worsening abdominal pain.  You have diarrhea and:  You have a rash, stiff neck, or severe headache.  You are irritable, sleepy, or difficult to awaken.  You are weak, dizzy, or extremely thirsty.  You have bright red blood in your stool or you have black tarry stools.  You have unusual abdominal swelling that is painful.  You vomit continuously.  You vomit blood (hematemesis).  You have both abdominal pain and a fever. This information is not intended to replace advice given to you by your health care provider. Make sure you discuss any questions you have with your health care provider. Document Released: 03/20/2005 Document Revised: 08/20/2015 Document Reviewed: 12/05/2013 Elsevier Interactive Patient Education  2017 [employer] Helicobacter Pylori Infection Helicobacter pylori infection is an infection in the stomach that is caused by the Helicobacter pylori (H. pylori) bacteria. This type of bacteria often lives in the lining of the stomach. The infection can cause ulcers and irritation (gastritis) in some people. It is the most common cause of ulcers in the stomach (gastric ulcer) and in the upper part of the intestine (duodenal ulcer). Having this infection may also increase the risk of stomach cancer and a type of white blood cell cancer (lymphoma) that affects the stomach. What are the causes? H. pylori is a type of bacteria that is often found in the stomachs of healthy people. The bacteria may be passed from person to person through contact with stool or saliva. It is not known why some people develop ulcers, gastritis, or cancer from the infection. What increases the risk? This condition is more likely to develop in people who:  Have family members with the infection.  Live with many other people, such as in a dormitory.  Are of African, Hispanic, or Asian descent. What are the signs or symptoms? Most people with this infection do not have symptoms. If  you do have symptoms, they may include:  Heartburn.  Stomach pain.  Nausea.  Vomiting.  Blood-tinged vomit.  Loss of appetite.  Bad breath. How is this diagnosed? This condition may be diagnosed based on your symptoms, a physical exam, and various tests. Tests may include:  Blood tests or stool tests to check for the proteins (antibodies) that your body may produce in response to the bacteria. These tests are the best way to confirm the diagnosis.  A breath test to check for the type of gas that the H. pylori bacteria release after breaking down a substance called urea. For the test, you are asked to drink urea. This test is often done after treatment in order to find out if the treatment worked.  A procedure in which a thin, flexible tube with a tiny camera at the end is placed into your stomach and upper intestine (upper endoscopy). Your health care provider may also take tissue samples (biopsy) to test for H. pylori and cancer. How is this treated? Treatment for this condition usually involves taking a combination of medicines (triple therapy) for a couple of weeks. Triple therapy includes one medicine to reduce the acid in your stomach and two  types of antibiotic medicines. Many drug combinations have been approved for treatment. Treatment usually kills the H. pylori and reduces your risk of cancer. You may need to be tested for H. pylori again after treatment. In some cases, the treatment may need to be repeated. Follow these instructions at home:  Take over-the-counter and prescription medicines only as told by your health care provider.  Take your antibiotics as told by your health care provider. Do not stop taking the antibiotics even if you start to feel better.  You can do all your usual activities and eat what you usually do.  Take steps to prevent future infections:  Wash your hands often.  Make sure the food you eat has been properly prepared.  Drink water only  from clean sources.  Keep all follow-up visits as told by your health care provider. This is important. Contact a health care provider if:  Your symptoms do not get better.  Your symptoms return after treatment. This information is not intended to replace advice given to you by your health care provider. Make sure you discuss any questions you have with your health care provider. Document Released: 07/12/2015 Document Revised: 08/26/2015 Document Reviewed: 04/01/2014 Elsevier Interactive Patient Education  2017 [employer].

## 2016-07-07 NOTE — Progress Notes (Signed)
Name: Johnny Liu   MRN: [id number]    DOB: 06/21/1965   Date:07/07/2016       Progress Note  Subjective  Chief Complaint  Chief Complaint  Patient presents with  . Abdominal Pain    mid, lower abdominal pain that begins after eating a big meal- "makes alot of noise in my intestines"- on pain scale- 5-6. Described as a gnawing sensation for a "couple of weeks"    Abdominal Pain  This is a new problem. The current episode started 1 to 4 weeks ago. The onset quality is gradual. The problem occurs 2 to 4 times per day. The problem has been waxing and waning. The pain is located in the generalized abdominal region. The pain is at a severity of 4/10. The pain is moderate. Quality: "burning grind" The abdominal pain does not radiate. Associated symptoms include belching, diarrhea, flatus and weight loss. Pertinent negatives include no constipation, dysuria, fever, frequency, headaches, hematochezia, hematuria, melena, myalgias, nausea or vomiting. The pain is aggravated by eating. The pain is relieved by bowel movements. He has tried antacids for the symptoms. The treatment provided mild relief. Prior diagnostic workup includes GI consult. There is no history of pancreatitis or PUD.    No problem-specific Assessment & Plan notes found for this encounter.   Past Medical History:  Diagnosis Date  . Anxiety     Past Surgical History:  Procedure Laterality Date  . APPENDECTOMY      No family history on file.  Social History   Social History  . Marital status: Married    Spouse name: N/A  . Number of children: N/A  . Years of education: N/A   Occupational History  . Not on file.   Social History Main Topics  . Smoking status: Never Smoker  . Smokeless tobacco: Never Used  . Alcohol use 0.0 oz/week  . Drug use: No  . Sexual activity: Not on file   Other Topics Concern  . Not on file   Social History Narrative  . No narrative on file    No Known Allergies  Outpatient  Medications Prior to Visit  Medication Sig Dispense Refill  . ALPRAZolam (XANAX) 0.25 MG tablet Take 1 tablet (0.25 mg total) by mouth 2 (two) times daily as needed for anxiety. Take one a day prn (Patient not taking: Reported on 07/07/2016) 30 tablet 5  . busPIRone (BUSPAR) 7.5 MG tablet Take 1 tablet (7.5 mg total) by mouth 2 (two) times daily. (Patient not taking: Reported on 05/26/2016) 60 tablet 2  . cyclobenzaprine (FLEXERIL) 10 MG tablet Take 1 tablet (10 mg total) by mouth 3 (three) times daily as needed for muscle spasms. (Patient not taking: Reported on 07/07/2016) 30 tablet 1  . etodolac (LODINE) 500 MG tablet Take 1 tablet (500 mg total) by mouth 2 (two) times daily. (Patient not taking: Reported on 05/26/2016) 60 tablet 3  . etodolac (LODINE) 500 MG tablet Take 1 tablet (500 mg total) by mouth 2 (two) times daily. 60 tablet 1  . predniSONE (DELTASONE) 10 MG tablet Take 1 tablet (10 mg total) by mouth daily with breakfast. 30 tablet 0  . traMADol (ULTRAM) 50 MG tablet Take 1 tablet (50 mg total) by mouth every 8 (eight) hours as needed. 30 tablet 0   No facility-administered medications prior to visit.     Review of Systems  Constitutional: Positive for weight loss. Negative for chills, fever and malaise/fatigue.  HENT: Negative for ear discharge, ear pain  and sore throat.   Eyes: Negative for blurred vision.  Respiratory: Negative for cough, sputum production, shortness of breath and wheezing.   Cardiovascular: Negative for chest pain, palpitations and leg swelling.  Gastrointestinal: Positive for abdominal pain, diarrhea and flatus. Negative for blood in stool, constipation, heartburn, hematochezia, melena, nausea and vomiting.  Genitourinary: Negative for dysuria, frequency, hematuria and urgency.  Musculoskeletal: Negative for back pain, joint pain, myalgias and neck pain.  Skin: Negative for rash.  Neurological: Negative for dizziness, tingling, sensory change, focal weakness and  headaches.  Endo/Heme/Allergies: Negative for environmental allergies and polydipsia. Does not bruise/bleed easily.  Psychiatric/Behavioral: Negative for depression and suicidal ideas. The patient is not nervous/anxious and does not have insomnia.      Objective  Vitals:   07/07/16 0839  BP: 120/80  Pulse: 78  Weight: 223 lb (101.2 kg)  Height: 5\' 8"  (1.727 m)    Physical Exam  Constitutional: He is oriented to person, place, and time and well-developed, well-nourished, and in no distress.  HENT:  Head: Normocephalic.  Right Ear: External ear normal.  Left Ear: External ear normal.  Nose: Nose normal.  Mouth/Throat: Oropharynx is clear and moist.  Eyes: Conjunctivae and EOM are normal. Pupils are equal, round, and reactive to light. Right eye exhibits no discharge. Left eye exhibits no discharge. No scleral icterus.  Neck: Normal range of motion. Neck supple. No JVD present. No tracheal deviation present. No thyromegaly present.  Cardiovascular: Normal rate, regular rhythm, normal heart sounds and intact distal pulses.  Exam reveals no gallop and no friction rub.   No murmur heard. Pulmonary/Chest: Breath sounds normal. No respiratory distress. He has no wheezes. He has no rales.  Abdominal: Soft. Bowel sounds are normal. He exhibits no mass. There is no hepatosplenomegaly. There is no tenderness. There is no rebound, no guarding and no CVA tenderness.  Musculoskeletal: Normal range of motion. He exhibits no edema or tenderness.  Lymphadenopathy:    He has no cervical adenopathy.  Neurological: He is alert and oriented to person, place, and time. He has normal sensation, normal strength, normal reflexes and intact cranial nerves. No cranial nerve deficit.  Skin: Skin is warm. No rash noted.  Psychiatric: Mood and affect normal.  Nursing note and vitals reviewed.     Assessment & Plan  Problem List Items Addressed This Visit    None    Visit Diagnoses    Generalized  abdominal pain    -  Primary   d/c aleve   Relevant Medications   pantoprazole (PROTONIX) 40 MG tablet   Other Relevant Orders   Lipase   Hepatic Function Panel (6)   H. pylori antibody, IgG   Ambulatory referral to Gastroenterology   POCT occult blood stool (Completed)   Irritable bowel syndrome with diarrhea       Relevant Medications   pantoprazole (PROTONIX) 40 MG tablet   Other Relevant Orders   Ambulatory referral to Gastroenterology   POCT occult blood stool (Completed)   Colon cancer screening       Relevant Orders   POCT occult blood stool (Completed)      Meds ordered this encounter  Medications  . pantoprazole (PROTONIX) 40 MG tablet    Sig: Take 1 tablet (40 mg total) by mouth daily.    Dispense:  30 tablet    Refill:  3      Dr. [facility]  07/07/16

## 2016-07-10 LAB — HEPATIC FUNCTION PANEL (6)
ALBUMIN: 4.7 g/dL (ref 3.5–5.5)
ALK PHOS: 54 IU/L (ref 39–117)
ALT: 48 IU/L — ABNORMAL HIGH (ref 0–44)
AST: 41 IU/L — ABNORMAL HIGH (ref 0–40)
Bilirubin Total: 0.5 mg/dL (ref 0.0–1.2)
Bilirubin, Direct: 0.14 mg/dL (ref 0.00–0.40)

## 2016-07-10 LAB — H. PYLORI ANTIBODY, IGG

## 2016-07-10 LAB — LIPASE: Lipase: 37 U/L (ref 13–78)

## 2016-07-31 ENCOUNTER — Encounter: Payer: Self-pay | Admitting: Family Medicine

## 2016-07-31 ENCOUNTER — Telehealth: Payer: Self-pay | Admitting: Family Medicine

## 2016-07-31 ENCOUNTER — Ambulatory Visit (INDEPENDENT_AMBULATORY_CARE_PROVIDER_SITE_OTHER): Payer: BLUE CROSS/BLUE SHIELD | Admitting: Family Medicine

## 2016-07-31 VITALS — BP 130/70 | HR 80 | Temp 98.3°F | Ht 68.0 in | Wt 222.0 lb

## 2016-07-31 DIAGNOSIS — J219 Acute bronchiolitis, unspecified: Secondary | ICD-10-CM

## 2016-07-31 DIAGNOSIS — J0100 Acute maxillary sinusitis, unspecified: Secondary | ICD-10-CM

## 2016-07-31 MED ORDER — GUAIFENESIN-CODEINE 100-10 MG/5ML PO SYRP: 5.0000 mL | ORAL_SOLUTION | Freq: Three times a day (TID) | ORAL | 0 refills | Status: DC | PRN

## 2016-07-31 MED ORDER — AZITHROMYCIN 250 MG PO TABS: ORAL_TABLET | ORAL | 1 refills | Status: DC

## 2016-07-31 NOTE — Progress Notes (Signed)
Name: Johnny Liu   MRN: [id number]    DOB: December 12, 1965   Date:07/31/2016       Progress Note  Subjective  Chief Complaint  Chief Complaint  Patient presents with  . Sinusitis    cough and cong- dark mucus, hoarse, coughing at night    Sinusitis  This is a new problem. The current episode started in the past 7 days. The problem has been gradually improving since onset. There has been no fever. The fever has been present for 3 to 4 days. The pain is mild. Associated symptoms include congestion, coughing, a hoarse voice, sinus pressure, sneezing and a sore throat. Pertinent negatives include no chills, diaphoresis, ear pain, headaches, neck pain or shortness of breath. Past treatments include nothing. The treatment provided mild relief.    No problem-specific Assessment & Plan notes found for this encounter.   Past Medical History:  Diagnosis Date  . Anxiety     Past Surgical History:  Procedure Laterality Date  . APPENDECTOMY      No family history on file.  Social History   Social History  . Marital status: Married    Spouse name: N/A  . Number of children: N/A  . Years of education: N/A   Occupational History  . Not on file.   Social History Main Topics  . Smoking status: Never Smoker  . Smokeless tobacco: Never Used  . Alcohol use 0.0 oz/week  . Drug use: No  . Sexual activity: Not on file   Other Topics Concern  . Not on file   Social History Narrative  . No narrative on file    No Known Allergies  Outpatient Medications Prior to Visit  Medication Sig Dispense Refill  . pantoprazole (PROTONIX) 40 MG tablet Take 1 tablet (40 mg total) by mouth daily. 30 tablet 3  . ALPRAZolam (XANAX) 0.25 MG tablet Take 1 tablet (0.25 mg total) by mouth 2 (two) times daily as needed for anxiety. Take one a day prn (Patient not taking: Reported on 07/07/2016) 30 tablet 5  . busPIRone (BUSPAR) 7.5 MG tablet Take 1 tablet (7.5 mg total) by mouth 2 (two) times daily. (Patient  not taking: Reported on 05/26/2016) 60 tablet 2  . cyclobenzaprine (FLEXERIL) 10 MG tablet Take 1 tablet (10 mg total) by mouth 3 (three) times daily as needed for muscle spasms. (Patient not taking: Reported on 07/07/2016) 30 tablet 1  . etodolac (LODINE) 500 MG tablet Take 1 tablet (500 mg total) by mouth 2 (two) times daily. (Patient not taking: Reported on 05/26/2016) 60 tablet 3   No facility-administered medications prior to visit.     Review of Systems  Constitutional: Negative for chills, diaphoresis, fever, malaise/fatigue and weight loss.  HENT: Positive for congestion, hoarse voice, sinus pressure, sneezing and sore throat. Negative for ear discharge and ear pain.   Eyes: Negative for blurred vision.  Respiratory: Positive for cough. Negative for sputum production, shortness of breath and wheezing.   Cardiovascular: Negative for chest pain, palpitations and leg swelling.  Gastrointestinal: Negative for abdominal pain, blood in stool, constipation, diarrhea, heartburn, melena and nausea.  Genitourinary: Negative for dysuria, frequency, hematuria and urgency.  Musculoskeletal: Negative for back pain, joint pain, myalgias and neck pain.  Skin: Negative for rash.  Neurological: Negative for dizziness, tingling, sensory change, focal weakness and headaches.  Endo/Heme/Allergies: Negative for environmental allergies and polydipsia. Does not bruise/bleed easily.  Psychiatric/Behavioral: Negative for depression and suicidal ideas. The patient is not nervous/anxious and  does not have insomnia.      Objective  Vitals:   07/31/16 1546  BP: 130/70  Pulse: 80  Temp: 98.3 F (36.8 C)  TempSrc: Oral  Weight: 222 lb (100.7 kg)  Height: 5\' 8"  (1.727 m)    Physical Exam  Constitutional: He is oriented to person, place, and time and well-developed, well-nourished, and in no distress.  HENT:  Head: Normocephalic.  Right Ear: External ear normal.  Left Ear: External ear normal.  Nose:  Nose normal.  Mouth/Throat: Oropharynx is clear and moist.  Eyes: Conjunctivae and EOM are normal. Pupils are equal, round, and reactive to light. Right eye exhibits no discharge. Left eye exhibits no discharge. No scleral icterus.  Neck: Normal range of motion. Neck supple. No JVD present. No tracheal deviation present. No thyromegaly present.  Cardiovascular: Normal rate, regular rhythm, normal heart sounds and intact distal pulses.  Exam reveals no gallop and no friction rub.   No murmur heard. Pulmonary/Chest: Breath sounds normal. No respiratory distress. He has no wheezes. He has no rales.  Abdominal: Soft. Bowel sounds are normal. He exhibits no mass. There is no hepatosplenomegaly. There is no tenderness. There is no rebound, no guarding and no CVA tenderness.  Musculoskeletal: Normal range of motion. He exhibits no edema or tenderness.  Lymphadenopathy:    He has no cervical adenopathy.  Neurological: He is alert and oriented to person, place, and time. He has normal sensation, normal strength and intact cranial nerves. No cranial nerve deficit.  Skin: Skin is warm. No rash noted.  Psychiatric: Mood and affect normal.  Nursing note and vitals reviewed.     Assessment & Plan  Problem List Items Addressed This Visit    None    Visit Diagnoses    Acute maxillary sinusitis, recurrence not specified    -  Primary   Relevant Medications   azithromycin (ZITHROMAX) 250 MG tablet   guaiFENesin-codeine (ROBITUSSIN AC) 100-10 MG/5ML syrup   Bronchiolitis       Relevant Medications   guaiFENesin-codeine (ROBITUSSIN AC) 100-10 MG/5ML syrup      Meds ordered this encounter  Medications  . azithromycin (ZITHROMAX) 250 MG tablet    Sig: 2 today then 1 a day for 4 days    Dispense:  6 tablet    Refill:  1  . guaiFENesin-codeine (ROBITUSSIN AC) 100-10 MG/5ML syrup    Sig: Take 5 mLs by mouth 3 (three) times daily as needed for cough.    Dispense:  150 mL    Refill:  0      Dr.  Vernis [facility]  07/31/16

## 2016-07-31 NOTE — Telephone Encounter (Signed)
Sinus Infection for 5 days /heavy dark mucus, cough/ no fever.  Would like RX called in if possible  Warrens Drung

## 2016-07-31 NOTE — Telephone Encounter (Signed)
Pt needs to be seen for antibiotic

## 2016-08-14 ENCOUNTER — Other Ambulatory Visit: Payer: Self-pay

## 2016-08-14 ENCOUNTER — Ambulatory Visit (INDEPENDENT_AMBULATORY_CARE_PROVIDER_SITE_OTHER): Payer: BLUE CROSS/BLUE SHIELD | Admitting: Gastroenterology

## 2016-08-14 ENCOUNTER — Encounter: Payer: Self-pay | Admitting: Gastroenterology

## 2016-08-14 VITALS — BP 149/82 | HR 82 | Temp 98.1°F | Ht 68.0 in | Wt 227.0 lb

## 2016-08-14 DIAGNOSIS — R748 Abnormal levels of other serum enzymes: Secondary | ICD-10-CM | POA: Diagnosis not present

## 2016-08-14 DIAGNOSIS — R195 Other fecal abnormalities: Secondary | ICD-10-CM

## 2016-08-14 DIAGNOSIS — K219 Gastro-esophageal reflux disease without esophagitis: Secondary | ICD-10-CM

## 2016-08-14 NOTE — Progress Notes (Signed)
Gastroenterology Consultation  Referring Provider:     Juline Patch, MD Primary Care Physician:  Johnny Patch, MD Primary Gastroenterologist:  Dr. Allen Liu     Reason for Consultation:     Generalized abdominal pain with a history of irritable bowel syndrome with diarrhea        HPI:   Johnny Liu is a 51 y.o. y/o male referred for consultation & management of Generalized abdominal pain by Dr. Ronnald Liu, Johnny Finn, MD.  This patient comes in today with a report of generalized abdominal pain. The patient was recently seen with sinusitis by his primary care provider. The patient also had recent liver enzymes that showed the AST of 41 and the ALT of 48. The patient had a stool occult positive card sent off in September 2017. The patient reports that his abdominal pain will come intermittently and last only a very short time. The patient also reports that he has heartburn and he takes Protonix for this. He takes the Protonix intermittently on an as-needed basis. He thinks that the Protonix makes him sleepy. There is no report of any unexplained weight loss and he states he has been trying to lose weight. The patient has not seen any black stools or bloody stools. He does report that he drinks socially but usually only beer on the weekend. He does report that his last liver enzymes were done while he was taking Advil for some musculoskeletal pains.  Past Medical History:  Diagnosis Date  . Anxiety     Past Surgical History:  Procedure Laterality Date  . APPENDECTOMY      Prior to Admission medications   Medication Sig Start Date End Date Taking? Authorizing Provider  azithromycin (ZITHROMAX) 250 MG tablet 2 today then 1 a day for 4 days 07/31/16   Johnny Patch, MD  guaiFENesin-codeine [facility]) 100-10 MG/5ML syrup Take 5 mLs by mouth 3 (three) times daily as needed for cough. 07/31/16   Johnny Patch, MD  pantoprazole (PROTONIX) 40 MG tablet Take 1 tablet (40 mg total) by mouth  daily. 07/07/16   Johnny Patch, MD    No family history on file.   Social History  Substance Use Topics  . Smoking status: Never Smoker  . Smokeless tobacco: Never Used  . Alcohol use 0.0 oz/week    Allergies as of 08/14/2016  . (No Known Allergies)    Review of Systems:    All systems reviewed and negative except where noted in HPI.   Physical Exam:  There were no vitals taken for this visit. No LMP for male patient. Psych:  Alert and cooperative. Normal mood and affect. General:   Alert,  Well-developed, well-nourished, pleasant and cooperative in NAD Head:  Normocephalic and atraumatic. Eyes:  Sclera clear, no icterus.   Conjunctiva pink. Ears:  Normal auditory acuity. Nose:  No deformity, discharge, or lesions. Mouth:  No deformity or lesions,oropharynx pink & moist. Neck:  Supple; no masses or thyromegaly. Lungs:  Respirations even and unlabored.  Clear throughout to auscultation.   No wheezes, crackles, or rhonchi. No acute distress. Heart:  Regular rate and rhythm; no murmurs, clicks, rubs, or gallops. Abdomen:  Normal bowel sounds.  No bruits.  Soft, non-tender and non-distended without masses, hepatosplenomegaly or hernias noted.  No guarding or rebound tenderness.  Negative Carnett sign.   Rectal:  Deferred.  Msk:  Symmetrical without gross deformities.  Good, equal movement & strength bilaterally. Pulses:  Normal pulses  noted. Extremities:  No clubbing or edema.  No cyanosis. Neurologic:  Alert and oriented x3;  grossly normal neurologically. Skin:  Intact without significant lesions or rashes.  No jaundice. Lymph Nodes:  No significant cervical adenopathy. Psych:  Alert and cooperative. Normal mood and affect.  Imaging Studies: No results found.  Assessment and Plan:   Johnny Liu is a 51 y.o. y/o male who comes in with a history of abnormal liver enzymes, heme-positive stools last September and heartburn. The patient will have his labs rechecked and if  his liver enzymes are still elevated he will undergo an ultrasound and further blood work to look for the cause of his abnormal liver enzymes. The patient will also be set up for an EGD and colonoscopy due to his heartburn and heme-positive stools. The patient has been given samples of Dexilant to see if the Dexilant helps his reflux symptoms without making him sleepy. The patient has been explained the plan and agrees with it.    Johnny Lame, MD. Marval Regal   Note: This dictation was prepared with Dragon dictation along with smaller phrase technology. Any transcriptional errors that result from this process are unintentional.

## 2016-08-15 ENCOUNTER — Telehealth: Payer: Self-pay | Admitting: Gastroenterology

## 2016-08-15 ENCOUNTER — Other Ambulatory Visit: Payer: Self-pay

## 2016-08-15 DIAGNOSIS — K219 Gastro-esophageal reflux disease without esophagitis: Secondary | ICD-10-CM

## 2016-08-15 DIAGNOSIS — R195 Other fecal abnormalities: Secondary | ICD-10-CM

## 2016-08-15 LAB — HEPATIC FUNCTION PANEL
ALBUMIN: 4.9 g/dL (ref 3.5–5.5)
ALT: 35 IU/L (ref 0–44)
AST: 36 IU/L (ref 0–40)
Alkaline Phosphatase: 63 IU/L (ref 39–117)
BILIRUBIN TOTAL: 0.3 mg/dL (ref 0.0–1.2)
BILIRUBIN, DIRECT: 0.12 mg/dL (ref 0.00–0.40)
Total Protein: 7.4 g/dL (ref 6.0–8.5)

## 2016-08-15 NOTE — Telephone Encounter (Signed)
08/15/16 10:56 am Spoke with Tammy at [facility] and NO prior auth req for [id number] / [id number] - K21.9 & R19.5

## 2016-08-23 ENCOUNTER — Encounter: Payer: Self-pay | Admitting: *Deleted

## 2016-08-24 ENCOUNTER — Telehealth: Payer: Self-pay

## 2016-08-24 ENCOUNTER — Ambulatory Visit: Admit: 2016-08-24 | Payer: BLUE CROSS/BLUE SHIELD | Admitting: Gastroenterology

## 2016-08-24 SURGERY — ESOPHAGOGASTRODUODENOSCOPY (EGD) WITH PROPOFOL
Anesthesia: Choice

## 2016-08-24 SURGERY — COLONOSCOPY WITH PROPOFOL
Anesthesia: Choice

## 2016-08-24 NOTE — Telephone Encounter (Signed)
-----   Message from Lucilla Lame, MD sent at 08/15/2016  1:54 PM EDT ----- Let the patient know that his liver enzymes came back to normal.

## 2016-08-24 NOTE — Telephone Encounter (Signed)
LVM letting pt know labs were normal

## 2016-08-30 ENCOUNTER — Ambulatory Visit: Payer: Self-pay | Admitting: Gastroenterology

## 2016-08-30 NOTE — Discharge Instructions (Signed)

## 2016-08-31 ENCOUNTER — Ambulatory Visit: Payer: BLUE CROSS/BLUE SHIELD | Admitting: Anesthesiology

## 2016-08-31 ENCOUNTER — Ambulatory Visit
Admission: RE | Admit: 2016-08-31 | Discharge: 2016-08-31 | Disposition: A | Discharge status: Home / Self Care | Payer: BLUE CROSS/BLUE SHIELD | Source: Ambulatory Visit | Attending: Gastroenterology | Admitting: Gastroenterology

## 2016-08-31 ENCOUNTER — Encounter
Admission: RE | Disposition: A | Discharge status: Home / Self Care | Payer: Self-pay | Source: Ambulatory Visit | Attending: Gastroenterology

## 2016-08-31 DIAGNOSIS — D125 Benign neoplasm of sigmoid colon: Secondary | ICD-10-CM | POA: Diagnosis not present

## 2016-08-31 DIAGNOSIS — K635 Polyp of colon: Secondary | ICD-10-CM

## 2016-08-31 DIAGNOSIS — K222 Esophageal obstruction: Secondary | ICD-10-CM | POA: Insufficient documentation

## 2016-08-31 DIAGNOSIS — K641 Second degree hemorrhoids: Secondary | ICD-10-CM | POA: Insufficient documentation

## 2016-08-31 DIAGNOSIS — R1310 Dysphagia, unspecified: Secondary | ICD-10-CM | POA: Insufficient documentation

## 2016-08-31 DIAGNOSIS — R12 Heartburn: Secondary | ICD-10-CM

## 2016-08-31 DIAGNOSIS — K921 Melena: Secondary | ICD-10-CM | POA: Diagnosis present

## 2016-08-31 DIAGNOSIS — F419 Anxiety disorder, unspecified: Secondary | ICD-10-CM | POA: Diagnosis not present

## 2016-08-31 DIAGNOSIS — K2970 Gastritis, unspecified, without bleeding: Secondary | ICD-10-CM | POA: Insufficient documentation

## 2016-08-31 DIAGNOSIS — D120 Benign neoplasm of cecum: Secondary | ICD-10-CM | POA: Diagnosis not present

## 2016-08-31 DIAGNOSIS — Z8719 Personal history of other diseases of the digestive system: Secondary | ICD-10-CM

## 2016-08-31 DIAGNOSIS — K219 Gastro-esophageal reflux disease without esophagitis: Secondary | ICD-10-CM | POA: Diagnosis not present

## 2016-08-31 DIAGNOSIS — Z79899 Other long term (current) drug therapy: Secondary | ICD-10-CM | POA: Diagnosis not present

## 2016-08-31 DIAGNOSIS — Z860101 Personal history of adenomatous and serrated colon polyps: Secondary | ICD-10-CM

## 2016-08-31 DIAGNOSIS — R195 Other fecal abnormalities: Secondary | ICD-10-CM | POA: Diagnosis not present

## 2016-08-31 HISTORY — PX: ESOPHAGOGASTRODUODENOSCOPY (EGD) WITH PROPOFOL: SHX5813

## 2016-08-31 HISTORY — PX: COLONOSCOPY WITH PROPOFOL: SHX5780

## 2016-08-31 HISTORY — DX: Gastro-esophageal reflux disease without esophagitis: K21.9

## 2016-08-31 HISTORY — PX: ESOPHAGEAL DILATION: SHX303

## 2016-08-31 SURGERY — COLONOSCOPY WITH PROPOFOL
Anesthesia: Monitor Anesthesia Care | Wound class: Contaminated

## 2016-08-31 MED ORDER — STERILE WATER FOR IRRIGATION IR SOLN
Status: DC | PRN
  Administered 2016-08-31: 10:00:00

## 2016-08-31 MED ORDER — GLYCOPYRROLATE 0.2 MG/ML IJ SOLN
INTRAMUSCULAR | Status: DC | PRN
  Administered 2016-08-31: 0.2 mg via INTRAVENOUS

## 2016-08-31 MED ORDER — ONDANSETRON HCL 4 MG/2ML IJ SOLN: 4.0000 mg | Freq: Once | INTRAMUSCULAR | Status: DC | PRN

## 2016-08-31 MED ORDER — LACTATED RINGERS IV SOLN
INTRAVENOUS | Status: DC
  Administered 2016-08-31: 10:00:00 via INTRAVENOUS

## 2016-08-31 MED ORDER — PROPOFOL 10 MG/ML IV BOLUS
INTRAVENOUS | Status: DC | PRN
  Administered 2016-08-31 (×5): 50 mg via INTRAVENOUS
  Administered 2016-08-31: 150 mg via INTRAVENOUS

## 2016-08-31 MED ORDER — LIDOCAINE HCL (CARDIAC) 20 MG/ML IV SOLN
INTRAVENOUS | Status: DC | PRN
  Administered 2016-08-31: 50 mg via INTRAVENOUS

## 2016-08-31 SURGICAL SUPPLY — 35 items
BALLN DILATOR 10-12 8 (BALLOONS)
BALLN DILATOR 12-15 8 (BALLOONS)
BALLN DILATOR 15-18 8 (BALLOONS) ×4
BALLN DILATOR CRE 0-12 8 (BALLOONS)
BALLN DILATOR ESOPH 8 10 CRE (MISCELLANEOUS) IMPLANT
BALLOON DILATOR 12-15 8 (BALLOONS) IMPLANT
BALLOON DILATOR 15-18 8 (BALLOONS) ×2 IMPLANT
BALLOON DILATOR CRE 0-12 8 (BALLOONS) IMPLANT
BLOCK BITE 60FR ADLT L/F GRN (MISCELLANEOUS) ×4 IMPLANT
CANISTER SUCT 1200ML W/VALVE (MISCELLANEOUS) ×4 IMPLANT
CLIP HMST 235XBRD CATH ROT (MISCELLANEOUS) IMPLANT
CLIP RESOLUTION 360 11X235 (MISCELLANEOUS)
FCP ESCP3.2XJMB 240X2.8X (MISCELLANEOUS) ×2
FORCEPS BIOP RAD 4 LRG CAP 4 (CUTTING FORCEPS) ×4 IMPLANT
FORCEPS BIOP RJ4 240 W/NDL (MISCELLANEOUS) ×2
FORCEPS ESCP3.2XJMB 240X2.8X (MISCELLANEOUS) ×2 IMPLANT
GOWN CVR UNV OPN BCK APRN NK (MISCELLANEOUS) ×4 IMPLANT
GOWN ISOL THUMB LOOP REG UNIV (MISCELLANEOUS) ×4
INJECTOR VARIJECT VIN23 (MISCELLANEOUS) IMPLANT
KIT DEFENDO VALVE AND CONN (KITS) IMPLANT
KIT ENDO PROCEDURE OLY (KITS) ×4 IMPLANT
MARKER SPOT ENDO TATTOO 5ML (MISCELLANEOUS) IMPLANT
PAD GROUND ADULT SPLIT (MISCELLANEOUS) IMPLANT
PROBE APC STR FIRE (PROBE) IMPLANT
RETRIEVER NET PLAT FOOD (MISCELLANEOUS) IMPLANT
RETRIEVER NET ROTH 2.5X230 LF (MISCELLANEOUS) IMPLANT
SNARE SHORT THROW 13M SML OVAL (MISCELLANEOUS) ×4 IMPLANT
SNARE SHORT THROW 30M LRG OVAL (MISCELLANEOUS) IMPLANT
SNARE SNG USE RND 15MM (INSTRUMENTS) IMPLANT
SPOT EX ENDOSCOPIC TATTOO (MISCELLANEOUS)
SYR INFLATION 60ML (SYRINGE) ×4 IMPLANT
TRAP ETRAP POLY (MISCELLANEOUS) ×4 IMPLANT
VARIJECT INJECTOR VIN23 (MISCELLANEOUS)
WATER STERILE IRR 250ML POUR (IV SOLUTION) ×4 IMPLANT
WIRE CRE 18-20MM 8CM F G (MISCELLANEOUS) IMPLANT

## 2016-08-31 NOTE — Anesthesia Procedure Notes (Signed)
Procedure Name: MAC Performed by: Mithra Spano Pre-anesthesia Checklist: Patient identified, Emergency Drugs available, Suction available, Timeout performed and Patient being monitored Patient Re-evaluated:Patient Re-evaluated prior to inductionOxygen Delivery Method: Nasal cannula Placement Confirmation: positive ETCO2     

## 2016-08-31 NOTE — Anesthesia Postprocedure Evaluation (Signed)
Anesthesia Post Note  Patient: Johnny Liu  Procedure(s) Performed: Procedure(s) (LRB): COLONOSCOPY WITH PROPOFOL (N/A) ESOPHAGOGASTRODUODENOSCOPY (EGD) WITH PROPOFOL (N/A) ESOPHAGEAL DILATION  Patient location during evaluation: PACU Anesthesia Type: MAC Level of consciousness: awake and alert Pain management: pain level controlled Vital Signs Assessment: post-procedure vital signs reviewed and stable Respiratory status: spontaneous breathing, nonlabored ventilation, respiratory function stable and patient connected to nasal cannula oxygen Cardiovascular status: stable and blood pressure returned to baseline Anesthetic complications: no    Amaryllis Dyke

## 2016-08-31 NOTE — Anesthesia Preprocedure Evaluation (Signed)
Anesthesia Evaluation  Patient identified by MRN, date of birth, ID band Patient awake    Reviewed: Allergy & Precautions, H&P , NPO status   Airway Mallampati: II  TM Distance: >3 FB Neck ROM: full    Dental   Pulmonary    Pulmonary exam normal        Cardiovascular Normal cardiovascular exam     Neuro/Psych    GI/Hepatic GERD  ,  Endo/Other    Renal/GU      Musculoskeletal   Abdominal   Peds  Hematology   Anesthesia Other Findings   Reproductive/Obstetrics                             Anesthesia Physical Anesthesia Plan  ASA: II  Anesthesia Plan: MAC   Post-op Pain Management:    Induction:   Airway Management Planned:   Additional Equipment:   Intra-op Plan:   Post-operative Plan:   Informed Consent: I have reviewed the patients History and Physical, chart, labs and discussed the procedure including the risks, benefits and alternatives for the proposed anesthesia with the patient or authorized representative who has indicated his/her understanding and acceptance.   Dental Advisory Given  Plan Discussed with: CRNA  Anesthesia Plan Comments:         Anesthesia Quick Evaluation

## 2016-08-31 NOTE — H&P (Signed)
   Johnny Lame, MD [facility] [address], [place], [place] [address] [phone] Fax : [phone]  Primary Care Physician:  Johnny Patch, MD Primary Gastroenterologist:  Dr. Allen Liu  Pre-Procedure History & Physical: HPI:  Johnny Liu is a 51 y.o. male is here for an endoscopy and colonoscopy.   Past Medical History:  Diagnosis Date  . Anxiety   . GERD (gastroesophageal reflux disease)     Past Surgical History:  Procedure Laterality Date  . APPENDECTOMY      Prior to Admission medications   Medication Sig Start Date End Date Taking? Authorizing Provider  Multiple Vitamin (MULTIVITAMIN) tablet Take 1 tablet by mouth daily.   Yes [provider]  pantoprazole (PROTONIX) 40 MG tablet Take 1 tablet (40 mg total) by mouth daily. 07/07/16   Johnny Patch, MD    Allergies as of 08/15/2016  . (No Known Allergies)    History reviewed. No pertinent family history.  Social History   Social History  . Marital status: Married    Spouse name: N/A  . Number of children: N/A  . Years of education: N/A   Occupational History  . Not on file.   Social History Main Topics  . Smoking status: Never Smoker  . Smokeless tobacco: Never Used  . Alcohol use 4.2 oz/week    7 Cans of beer per week  . Drug use: No  . Sexual activity: Not on file   Other Topics Concern  . Not on file   Social History Narrative  . No narrative on file    Review of Systems: See HPI, otherwise negative ROS  Physical Exam: Ht 5\' 8"  (1.727 m)   Wt 227 lb (103 kg)   BMI 34.52 kg/m  General:   Alert,  pleasant and cooperative in NAD Head:  Normocephalic and atraumatic. Neck:  Supple; no masses or thyromegaly. Lungs:  Clear throughout to auscultation.    Heart:  Regular rate and rhythm. Abdomen:  Soft, nontender and nondistended. Normal bowel sounds, without guarding, and without rebound.   Neurologic:  Alert and  oriented x4;  grossly normal  neurologically.  Impression/Plan: Johnny Liu is here for an endoscopy and colonoscopy to be performed for heme positive and GERD stools  Risks, benefits, limitations, and alternatives regarding  endoscopy and colonoscopy have been reviewed with the patient.  Questions have been answered.  All parties agreeable.   Johnny Lame, MD  08/31/2016, 8:58 AM

## 2016-08-31 NOTE — Transfer of Care (Signed)
Immediate Anesthesia Transfer of Care Note  Patient: Johnny Liu  Procedure(s) Performed: Procedure(s): COLONOSCOPY WITH PROPOFOL (N/A) ESOPHAGOGASTRODUODENOSCOPY (EGD) WITH PROPOFOL (N/A) ESOPHAGEAL DILATION  Patient Location: PACU  Anesthesia Type: MAC  Level of Consciousness: awake, alert  and patient cooperative  Airway and Oxygen Therapy: Patient Spontanous Breathing and Patient connected to supplemental oxygen  Post-op Assessment: Post-op Vital signs reviewed, Patient's Cardiovascular Status Stable, Respiratory Function Stable, Patent Airway and No signs of Nausea or vomiting  Post-op Vital Signs: Reviewed and stable  Complications: No apparent anesthesia complications

## 2016-08-31 NOTE — Op Note (Signed)
[facility] Gastroenterology Patient Name: Johnny Liu Procedure Date: 08/31/2016 10:03 AM MRN: [id number] Account #: [id number] Date of Birth: 1965-06-08 Admit Type: Outpatient Age: 51 Room: [facility] OR ROOM 01 Gender: Male Note Status: Finalized Procedure:            Colonoscopy Indications:          Heme positive stool Providers:            Lucilla Lame MD, MD Referring MD:         Juline Patch, MD (Referring MD) Medicines:            Propofol per Anesthesia Complications:        No immediate complications. Procedure:            Pre-Anesthesia Assessment:                       - Prior to the procedure, a History and Physical was                        performed, and patient medications and allergies were                        reviewed. The patient's tolerance of previous                        anesthesia was also reviewed. The risks and benefits of                        the procedure and the sedation options and risks were                        discussed with the patient. All questions were                        answered, and informed consent was obtained. Prior                        Anticoagulants: The patient has taken no previous                        anticoagulant or antiplatelet agents. ASA Grade                        Assessment: II - A patient with mild systemic disease.                        After reviewing the risks and benefits, the patient was                        deemed in satisfactory condition to undergo the                        procedure.                       After obtaining informed consent, the colonoscope was                        passed under direct vision. Throughout the procedure,  the patient's blood pressure, pulse, and oxygen                        saturations were monitored continuously. The [place] [id number]) was introduced through the                        anus  and advanced to the the cecum, identified by                        appendiceal orifice and ileocecal valve. The                        colonoscopy was performed without difficulty. The                        patient tolerated the procedure well. The quality of                        the bowel preparation was excellent. Findings:      The perianal and digital rectal examinations were normal.      A 4 mm polyp was found in the cecum. The polyp was sessile. The polyp       was removed with a cold snare. Resection and retrieval were complete.      Two sessile polyps were found in the sigmoid colon. The polyps were 3 to       5 mm in size. These polyps were removed with a cold snare. Resection and       retrieval were complete.      Non-bleeding internal hemorrhoids were found during retroflexion. The       hemorrhoids were Grade II (internal hemorrhoids that prolapse but reduce       spontaneously). Impression:           - One 4 mm polyp in the cecum, removed with a cold                        snare. Resected and retrieved.                       - Two 3 to 5 mm polyps in the sigmoid colon, removed                        with a cold snare. Resected and retrieved.                       - Non-bleeding internal hemorrhoids. Recommendation:       - Discharge patient to home.                       - Resume previous diet.                       - Continue present medications.                       - Await pathology results.                       -  Repeat colonoscopy in 5 years if polyp adenoma and 10                        years if hyperplastic Procedure Code(s):    --- Professional ---                       [phone], Colonoscopy, flexible; with removal of tumor(s),                        polyp(s), or other lesion(s) by snare technique Diagnosis Code(s):    --- Professional ---                       R19.5, Other fecal abnormalities                       D12.0, Benign neoplasm of cecum                        D12.5, Benign neoplasm of sigmoid colon CPT copyright 2016 American Medical Association. All rights reserved. The codes documented in this report are preliminary and upon coder review may  be revised to meet current compliance requirements. Lucilla Lame MD, MD 08/31/2016 10:38:09 AM This report has been signed electronically. Number of Addenda: 0 Note Initiated On: 08/31/2016 10:03 AM Scope Withdrawal Time: 0 hours 7 minutes 57 seconds  Total Procedure Duration: 0 hours 10 minutes 25 seconds       [facility]

## 2016-08-31 NOTE — Op Note (Signed)
[facility] Gastroenterology Patient Name: Johnny Liu Procedure Date: 08/31/2016 9:59 AM MRN: [id number] Account #: [id number] Date of Birth: January 24, 1966 Admit Type: Outpatient Age: 51 Room: [facility] OR ROOM 01 Gender: Male Note Status: Finalized Procedure:            Upper GI endoscopy Indications:          Dysphagia, Heartburn Providers:            Lucilla Lame MD, MD Referring MD:         Juline Patch, MD (Referring MD) Medicines:            Propofol per Anesthesia Complications:        No immediate complications. Procedure:            Pre-Anesthesia Assessment:                       - Prior to the procedure, a History and Physical was                        performed, and patient medications and allergies were                        reviewed. The patient's tolerance of previous                        anesthesia was also reviewed. The risks and benefits of                        the procedure and the sedation options and risks were                        discussed with the patient. All questions were                        answered, and informed consent was obtained. Prior                        Anticoagulants: The patient has taken no previous                        anticoagulant or antiplatelet agents. ASA Grade                        Assessment: II - A patient with mild systemic disease.                        After reviewing the risks and benefits, the patient was                        deemed in satisfactory condition to undergo the                        procedure.                       After obtaining informed consent, the endoscope was                        passed under direct vision. Throughout the procedure,  the patient's blood pressure, pulse, and oxygen                        saturations were monitored continuously. The Olympus                        GIF H180J Endoscope (D#:[id number]) was introduced through    the mouth, and advanced to the second part of duodenum.                        The upper GI endoscopy was accomplished without                        difficulty. The patient tolerated the procedure well. Findings:      One mild benign-appearing, intrinsic stenosis was found at the       gastroesophageal junction. And was traversed. A TTS dilator was passed       through the scope. Dilation with a 15-16.5-18 mm balloon dilator was       performed to 18 mm. The dilation site was examined following endoscope       reinsertion and showed complete resolution of luminal narrowing.      Localized mild inflammation characterized by erythema was found in the       gastric antrum. Biopsies were taken with a cold forceps for histology.      The examined duodenum was normal. Impression:           - Benign-appearing esophageal stenosis. Dilated.                       - Gastritis. Biopsied.                       - Normal examined duodenum. Recommendation:       - Await pathology results.                       - Discharge patient to home.                       - Resume previous diet.                       - Continue present medications.                       - Await pathology results. Procedure Code(s):    --- Professional ---                       [phone], Esophagogastroduodenoscopy, flexible, transoral;                        with transendoscopic balloon dilation of esophagus                        (less than 30 mm diameter)                       43239, Esophagogastroduodenoscopy, flexible, transoral;                        with biopsy, single or multiple Diagnosis Code(s):    --- Professional ---  R12, Heartburn                       R13.10, Dysphagia, unspecified                       K29.70, Gastritis, unspecified, without bleeding                       K22.2, Esophageal obstruction CPT copyright 2016 American Medical Association. All rights reserved. The codes documented in  this report are preliminary and upon coder review may  be revised to meet current compliance requirements. Lucilla Lame MD, MD 08/31/2016 10:23:46 AM This report has been signed electronically. Number of Addenda: 0 Note Initiated On: 08/31/2016 9:59 AM      [facility]

## 2016-09-01 ENCOUNTER — Encounter: Payer: Self-pay | Admitting: Gastroenterology

## 2016-09-05 ENCOUNTER — Encounter: Payer: Self-pay | Admitting: Gastroenterology

## 2016-09-07 ENCOUNTER — Other Ambulatory Visit: Payer: Self-pay | Admitting: Family Medicine

## 2016-12-05 DIAGNOSIS — Z13220 Encounter for screening for lipoid disorders: Secondary | ICD-10-CM | POA: Diagnosis not present

## 2016-12-05 DIAGNOSIS — Z136 Encounter for screening for cardiovascular disorders: Secondary | ICD-10-CM | POA: Diagnosis not present

## 2016-12-05 DIAGNOSIS — Z131 Encounter for screening for diabetes mellitus: Secondary | ICD-10-CM | POA: Diagnosis not present

## 2016-12-05 DIAGNOSIS — Z713 Dietary counseling and surveillance: Secondary | ICD-10-CM | POA: Diagnosis not present

## 2016-12-05 DIAGNOSIS — Z6832 Body mass index (BMI) 32.0-32.9, adult: Secondary | ICD-10-CM | POA: Diagnosis not present

## 2016-12-12 ENCOUNTER — Other Ambulatory Visit: Payer: Self-pay

## 2016-12-12 DIAGNOSIS — Z23 Encounter for immunization: Secondary | ICD-10-CM | POA: Diagnosis not present

## 2016-12-13 ENCOUNTER — Ambulatory Visit: Payer: Self-pay | Admitting: Family Medicine

## 2017-01-30 DIAGNOSIS — N401 Enlarged prostate with lower urinary tract symptoms: Secondary | ICD-10-CM | POA: Diagnosis not present

## 2017-01-30 DIAGNOSIS — N138 Other obstructive and reflux uropathy: Secondary | ICD-10-CM | POA: Diagnosis not present

## 2017-01-30 DIAGNOSIS — N529 Male erectile dysfunction, unspecified: Secondary | ICD-10-CM | POA: Diagnosis not present

## 2017-01-30 DIAGNOSIS — E291 Testicular hypofunction: Secondary | ICD-10-CM | POA: Diagnosis not present

## 2017-01-30 DIAGNOSIS — N411 Chronic prostatitis: Secondary | ICD-10-CM | POA: Diagnosis not present

## 2017-03-14 DIAGNOSIS — E291 Testicular hypofunction: Secondary | ICD-10-CM | POA: Diagnosis not present

## 2017-03-21 DIAGNOSIS — E291 Testicular hypofunction: Secondary | ICD-10-CM | POA: Diagnosis not present

## 2017-04-04 DIAGNOSIS — E291 Testicular hypofunction: Secondary | ICD-10-CM | POA: Diagnosis not present

## 2017-05-04 ENCOUNTER — Encounter: Payer: Self-pay | Admitting: Family Medicine

## 2017-05-04 ENCOUNTER — Ambulatory Visit: Payer: BLUE CROSS/BLUE SHIELD | Admitting: Family Medicine

## 2017-05-04 ENCOUNTER — Ambulatory Visit (INDEPENDENT_AMBULATORY_CARE_PROVIDER_SITE_OTHER): Payer: BLUE CROSS/BLUE SHIELD | Admitting: Family Medicine

## 2017-05-04 VITALS — BP 130/80 | HR 88 | Ht 68.0 in | Wt 231.0 lb

## 2017-05-04 DIAGNOSIS — J4520 Mild intermittent asthma, uncomplicated: Secondary | ICD-10-CM | POA: Diagnosis not present

## 2017-05-04 DIAGNOSIS — J40 Bronchitis, not specified as acute or chronic: Secondary | ICD-10-CM

## 2017-05-04 MED ORDER — GUAIFENESIN-CODEINE 100-10 MG/5ML PO SYRP: 5.0000 mL | ORAL_SOLUTION | Freq: Three times a day (TID) | ORAL | 0 refills | Status: DC | PRN

## 2017-05-04 MED ORDER — AZITHROMYCIN 250 MG PO TABS: ORAL_TABLET | ORAL | 0 refills | Status: DC

## 2017-05-04 MED ORDER — PREDNISONE 10 MG PO TABS: 10.0000 mg | ORAL_TABLET | Freq: Every day | ORAL | 0 refills | Status: DC

## 2017-05-04 NOTE — Progress Notes (Signed)
Name: Johnny Liu   MRN: [id number]    DOB: 1965/12/10   Date:05/04/2017       Progress Note  Subjective  Chief Complaint  Chief Complaint  Patient presents with  . Sinusitis    cong and cough- dk green production.    Cough  This is a new problem. The current episode started 1 to 4 weeks ago (2 weeks ago). The problem has been waxing and waning. The problem occurs hourly. The cough is productive of purulent sputum (green). Associated symptoms include chills, a fever, postnasal drip, a sore throat, shortness of breath and wheezing. Pertinent negatives include no chest pain, ear congestion, ear pain, headaches, heartburn, hemoptysis, myalgias, nasal congestion, rash, rhinorrhea, sweats or weight loss. Associated symptoms comments: "rattle/tight". He has tried OTC cough suppressant and prescription cough suppressant for the symptoms. The treatment provided mild relief. There is no history of asthma, environmental allergies or pneumonia.    No problem-specific Assessment & Plan notes found for this encounter.   Past Medical History:  Diagnosis Date  . Anxiety   . GERD (gastroesophageal reflux disease)     Past Surgical History:  Procedure Laterality Date  . APPENDECTOMY    . COLONOSCOPY WITH PROPOFOL N/A 08/31/2016   Procedure: COLONOSCOPY WITH PROPOFOL;  Surgeon: Lucilla Lame, MD;  Location: [place];  Service: Endoscopy;  Laterality: N/A;  . ESOPHAGEAL DILATION  08/31/2016   Procedure: ESOPHAGEAL DILATION;  Surgeon: Lucilla Lame, MD;  Location: [place];  Service: Endoscopy;;  . ESOPHAGOGASTRODUODENOSCOPY (EGD) WITH PROPOFOL N/A 08/31/2016   Procedure: ESOPHAGOGASTRODUODENOSCOPY (EGD) WITH PROPOFOL;  Surgeon: Lucilla Lame, MD;  Location: [place];  Service: Endoscopy;  Laterality: N/A;    History reviewed. No pertinent family history.  Social History   Socioeconomic History  . Marital status: Married    Spouse name: Not on file  . Number of  children: Not on file  . Years of education: Not on file  . Highest education level: Not on file  Social Needs  . Financial resource strain: Not on file  . Food insecurity - worry: Not on file  . Food insecurity - inability: Not on file  . Transportation needs - medical: Not on file  . Transportation needs - non-medical: Not on file  Occupational History  . Not on file  Tobacco Use  . Smoking status: Never Smoker  . Smokeless tobacco: Never Used  Substance and Sexual Activity  . Alcohol use: Yes    Alcohol/week: 4.2 oz    Types: 7 Cans of beer per week  . Drug use: No  . Sexual activity: Not on file  Other Topics Concern  . Not on file  Social History Narrative  . Not on file    No Known Allergies  Outpatient Medications Prior to Visit  Medication Sig Dispense Refill  . Multiple Vitamin (MULTIVITAMIN) tablet Take 1 tablet by mouth daily.    . pantoprazole (PROTONIX) 40 MG tablet Take 1 tablet (40 mg total) by mouth daily. (Patient not taking: Reported on 05/04/2017) 30 tablet 3   No facility-administered medications prior to visit.     Review of Systems  Constitutional: Positive for chills and fever. Negative for malaise/fatigue and weight loss.  HENT: Positive for postnasal drip and sore throat. Negative for ear discharge, ear pain and rhinorrhea.   Eyes: Negative for blurred vision.  Respiratory: Positive for cough, shortness of breath and wheezing. Negative for hemoptysis and sputum production.   Cardiovascular: Negative for chest  pain, palpitations and leg swelling.  Gastrointestinal: Negative for abdominal pain, blood in stool, constipation, diarrhea, heartburn, melena and nausea.  Genitourinary: Negative for dysuria, frequency, hematuria and urgency.  Musculoskeletal: Negative for back pain, joint pain, myalgias and neck pain.  Skin: Negative for rash.  Neurological: Negative for dizziness, tingling, sensory change, focal weakness and headaches.   Endo/Heme/Allergies: Negative for environmental allergies and polydipsia. Does not bruise/bleed easily.  Psychiatric/Behavioral: Negative for depression and suicidal ideas. The patient is not nervous/anxious and does not have insomnia.      Objective  Vitals:   05/04/17 1141  BP: 130/80  Pulse: 88  Weight: 231 lb (104.8 kg)  Height: 5\' 8"  (1.727 m)    Physical Exam  Constitutional: He is oriented to person, place, and time and well-developed, well-nourished, and in no distress.  HENT:  Head: Normocephalic.  Right Ear: External ear normal.  Left Ear: External ear normal.  Nose: Nose normal.  Mouth/Throat: Oropharynx is clear and moist.  Eyes: Conjunctivae and EOM are normal. Pupils are equal, round, and reactive to light. Right eye exhibits no discharge. Left eye exhibits no discharge. No scleral icterus.  Neck: Normal range of motion. Neck supple. No JVD present. No tracheal deviation present. No thyromegaly present.  Cardiovascular: Normal rate, regular rhythm, normal heart sounds and intact distal pulses. Exam reveals no gallop and no friction rub.  No murmur heard. Pulmonary/Chest: No respiratory distress. He has wheezes. He has no rales.  Abdominal: Soft. Bowel sounds are normal. He exhibits no mass. There is no hepatosplenomegaly. There is no tenderness. There is no rebound, no guarding and no CVA tenderness.  Musculoskeletal: Normal range of motion. He exhibits no edema or tenderness.  Lymphadenopathy:    He has no cervical adenopathy.  Neurological: He is alert and oriented to person, place, and time. He has normal sensation, normal strength and intact cranial nerves.  Skin: Skin is warm. No rash noted.  Psychiatric: Mood and affect normal.  Nursing note and vitals reviewed.     Assessment & Plan  Problem List Items Addressed This Visit    None    Visit Diagnoses    Bronchitis    -  Primary   Relevant Medications   azithromycin (ZITHROMAX) 250 MG tablet    guaiFENesin-codeine (ROBITUSSIN AC) 100-10 MG/5ML syrup   Mild intermittent reactive airway disease without complication       Relevant Medications   predniSONE (DELTASONE) 10 MG tablet   guaiFENesin-codeine (ROBITUSSIN AC) 100-10 MG/5ML syrup      Meds ordered this encounter  Medications  . azithromycin (ZITHROMAX) 250 MG tablet    Sig: 2 today then 1 a day for 4 days    Dispense:  6 tablet    Refill:  0  . predniSONE (DELTASONE) 10 MG tablet    Sig: Take 1 tablet (10 mg total) by mouth daily with breakfast.    Dispense:  30 tablet    Refill:  0  . guaiFENesin-codeine (ROBITUSSIN AC) 100-10 MG/5ML syrup    Sig: Take 5 mLs by mouth 3 (three) times daily as needed for cough.    Dispense:  150 mL    Refill:  0      Dr. Otilio Miu [facility]  05/04/17

## 2017-06-22 ENCOUNTER — Encounter: Payer: Self-pay | Admitting: Family Medicine

## 2017-06-22 ENCOUNTER — Ambulatory Visit (INDEPENDENT_AMBULATORY_CARE_PROVIDER_SITE_OTHER): Payer: BLUE CROSS/BLUE SHIELD | Admitting: Family Medicine

## 2017-06-22 VITALS — BP 130/94 | HR 84 | Ht 68.0 in | Wt 230.0 lb

## 2017-06-22 DIAGNOSIS — N342 Other urethritis: Secondary | ICD-10-CM | POA: Diagnosis not present

## 2017-06-22 LAB — POCT URINALYSIS DIPSTICK
Bilirubin, UA: NEGATIVE
Glucose, UA: NEGATIVE
Ketones, UA: NEGATIVE
LEUKOCYTES UA: NEGATIVE
NITRITE UA: NEGATIVE
PH UA: 6 (ref 5.0–8.0)
PROTEIN UA: NEGATIVE
RBC UA: NEGATIVE
SPEC GRAV UA: 1.015 (ref 1.010–1.025)
Urobilinogen, UA: 0.2 E.U./dL

## 2017-06-22 MED ORDER — DOXYCYCLINE HYCLATE 100 MG PO TABS: 100.0000 mg | ORAL_TABLET | Freq: Two times a day (BID) | ORAL | 0 refills | Status: DC

## 2017-06-22 MED ORDER — FLUCONAZOLE 150 MG PO TABS: 150.0000 mg | ORAL_TABLET | Freq: Once | ORAL | 0 refills | Status: DC

## 2017-06-22 NOTE — Progress Notes (Signed)
Name: Johnny Liu   MRN: [id number]    DOB: [birth year]   Date:06/22/2017       Progress Note  Subjective  Chief Complaint  Chief Complaint  Patient presents with  . Urinary Tract Infection    burning at end of urination and discomfort with it as well x 1 week    Urinary Tract Infection   This is a new problem. The current episode started in the past 7 days. The problem occurs every urination. The problem has been unchanged. The quality of the pain is described as burning. The pain is at a severity of 2/10. The pain is moderate. There has been no fever. He is sexually active. Associated symptoms include frequency and urgency. Pertinent negatives include no chills, discharge, flank pain, hematuria, hesitancy, nausea, sweats or vomiting. He has tried nothing for the symptoms.    No problem-specific Assessment & Plan notes found for this encounter.   Past Medical History:  Diagnosis Date  . Anxiety   . GERD (gastroesophageal reflux disease)     Past Surgical History:  Procedure Laterality Date  . APPENDECTOMY    . COLONOSCOPY WITH PROPOFOL N/A 08/31/2016   Procedure: COLONOSCOPY WITH PROPOFOL;  Surgeon: Lucilla Lame, MD;  Location: [place];  Service: Endoscopy;  Laterality: N/A;  . ESOPHAGEAL DILATION  08/31/2016   Procedure: ESOPHAGEAL DILATION;  Surgeon: Lucilla Lame, MD;  Location: [place];  Service: Endoscopy;;  . ESOPHAGOGASTRODUODENOSCOPY (EGD) WITH PROPOFOL N/A 08/31/2016   Procedure: ESOPHAGOGASTRODUODENOSCOPY (EGD) WITH PROPOFOL;  Surgeon: Lucilla Lame, MD;  Location: [place];  Service: Endoscopy;  Laterality: N/A;    History reviewed. No pertinent family history.  Social History   Socioeconomic History  . Marital status: Married    Spouse name: Not on file  . Number of children: Not on file  . Years of education: Not on file  . Highest education level: Not on file  Occupational History  . Not on file  Social Needs  . Financial  resource strain: Not on file  . Food insecurity:    Worry: Not on file    Inability: Not on file  . Transportation needs:    Medical: Not on file    Non-medical: Not on file  Tobacco Use  . Smoking status: Never Smoker  . Smokeless tobacco: Never Used  Substance and Sexual Activity  . Alcohol use: Yes    Alcohol/week: 4.2 oz    Types: 7 Cans of beer per week  . Drug use: No  . Sexual activity: Not on file  Lifestyle  . Physical activity:    Days per week: Not on file    Minutes per session: Not on file  . Stress: Not on file  Relationships  . Social connections:    Talks on phone: Not on file    Gets together: Not on file    Attends religious service: Not on file    Active member of club or organization: Not on file    Attends meetings of clubs or organizations: Not on file    Relationship status: Not on file  . Intimate partner violence:    Fear of current or ex partner: Not on file    Emotionally abused: Not on file    Physically abused: Not on file    Forced sexual activity: Not on file  Other Topics Concern  . Not on file  Social History Narrative  . Not on file    No Known Allergies  Outpatient Medications Prior to Visit  Medication Sig Dispense Refill  . Multiple Vitamin (MULTIVITAMIN) tablet Take 1 tablet by mouth daily.    . pantoprazole (PROTONIX) 40 MG tablet Take 1 tablet (40 mg total) by mouth daily. (Patient not taking: Reported on 05/04/2017) 30 tablet 3  . azithromycin (ZITHROMAX) 250 MG tablet 2 today then 1 a day for 4 days 6 tablet 0  . guaiFENesin-codeine (ROBITUSSIN AC) 100-10 MG/5ML syrup Take 5 mLs by mouth 3 (three) times daily as needed for cough. 150 mL 0  . predniSONE (DELTASONE) 10 MG tablet Take 1 tablet (10 mg total) by mouth daily with breakfast. 30 tablet 0   No facility-administered medications prior to visit.     Review of Systems  Constitutional: Negative for chills, fever, malaise/fatigue and weight loss.  HENT: Negative for ear  discharge, ear pain and sore throat.   Eyes: Negative for blurred vision.  Respiratory: Negative for cough, sputum production, shortness of breath and wheezing.   Cardiovascular: Negative for chest pain, palpitations and leg swelling.  Gastrointestinal: Negative for abdominal pain, blood in stool, constipation, diarrhea, heartburn, melena, nausea and vomiting.  Genitourinary: Positive for frequency and urgency. Negative for dysuria, flank pain, hematuria and hesitancy.  Musculoskeletal: Negative for back pain, joint pain, myalgias and neck pain.  Skin: Negative for rash.  Neurological: Negative for dizziness, tingling, sensory change, focal weakness and headaches.  Endo/Heme/Allergies: Negative for environmental allergies and polydipsia. Does not bruise/bleed easily.  Psychiatric/Behavioral: Negative for depression and suicidal ideas. The patient is not nervous/anxious and does not have insomnia.      Objective  Vitals:   06/22/17 0929  BP: (!) 130/94  Pulse: 84  Weight: 230 lb (104.3 kg)  Height: 5\' 8"  (1.727 m)    Physical Exam  Constitutional: He is oriented to person, place, and time and well-developed, well-nourished, and in no distress.  HENT:  Head: Normocephalic.  Right Ear: External ear normal.  Left Ear: External ear normal.  Nose: Nose normal.  Mouth/Throat: Oropharynx is clear and moist.  Eyes: Pupils are equal, round, and reactive to light. Conjunctivae and EOM are normal. Right eye exhibits no discharge. Left eye exhibits no discharge. No scleral icterus.  Neck: Normal range of motion. Neck supple. No JVD present. No tracheal deviation present. No thyromegaly present.  Cardiovascular: Normal rate, regular rhythm, normal heart sounds and intact distal pulses. Exam reveals no gallop and no friction rub.  No murmur heard. Pulmonary/Chest: Breath sounds normal. No respiratory distress. He has no wheezes. He has no rales.  Abdominal: Soft. Bowel sounds are normal. He  exhibits no mass. There is no hepatosplenomegaly. There is no tenderness. There is no rebound, no guarding and no CVA tenderness.  Genitourinary:  Genitourinary Comments: Urethral opening erythema  Musculoskeletal: Normal range of motion. He exhibits no edema or tenderness.  Lymphadenopathy:    He has no cervical adenopathy.  Neurological: He is alert and oriented to person, place, and time. He has normal sensation, normal strength, normal reflexes and intact cranial nerves. No cranial nerve deficit.  Skin: Skin is warm. No rash noted.  Psychiatric: Mood and affect normal.  Nursing note and vitals reviewed.     Assessment & Plan  Problem List Items Addressed This Visit    None    Visit Diagnoses    Urethritis    -  Primary   Relevant Medications   doxycycline (VIBRA-TABS) 100 MG tablet   fluconazole (DIFLUCAN) 150 MG tablet   Other Relevant  Orders   POCT urinalysis dipstick (Completed)      Meds ordered this encounter  Medications  . doxycycline (VIBRA-TABS) 100 MG tablet    Sig: Take 1 tablet (100 mg total) by mouth 2 (two) times daily.    Dispense:  20 tablet    Refill:  0  . fluconazole (DIFLUCAN) 150 MG tablet    Sig: Take 1 tablet (150 mg total) by mouth once for 1 dose.    Dispense:  1 tablet    Refill:  0      Dr. Otilio Miu [facility]  06/22/17

## 2017-09-20 DIAGNOSIS — M9903 Segmental and somatic dysfunction of lumbar region: Secondary | ICD-10-CM | POA: Diagnosis not present

## 2017-09-20 DIAGNOSIS — M955 Acquired deformity of pelvis: Secondary | ICD-10-CM | POA: Diagnosis not present

## 2017-09-20 DIAGNOSIS — M9905 Segmental and somatic dysfunction of pelvic region: Secondary | ICD-10-CM | POA: Diagnosis not present

## 2017-09-20 DIAGNOSIS — M5442 Lumbago with sciatica, left side: Secondary | ICD-10-CM | POA: Diagnosis not present

## 2017-09-21 DIAGNOSIS — M5442 Lumbago with sciatica, left side: Secondary | ICD-10-CM | POA: Diagnosis not present

## 2017-09-21 DIAGNOSIS — M955 Acquired deformity of pelvis: Secondary | ICD-10-CM | POA: Diagnosis not present

## 2017-09-21 DIAGNOSIS — M9903 Segmental and somatic dysfunction of lumbar region: Secondary | ICD-10-CM | POA: Diagnosis not present

## 2017-09-21 DIAGNOSIS — M9905 Segmental and somatic dysfunction of pelvic region: Secondary | ICD-10-CM | POA: Diagnosis not present

## 2017-09-24 DIAGNOSIS — M5442 Lumbago with sciatica, left side: Secondary | ICD-10-CM | POA: Diagnosis not present

## 2017-09-24 DIAGNOSIS — M9903 Segmental and somatic dysfunction of lumbar region: Secondary | ICD-10-CM | POA: Diagnosis not present

## 2017-09-24 DIAGNOSIS — M9905 Segmental and somatic dysfunction of pelvic region: Secondary | ICD-10-CM | POA: Diagnosis not present

## 2017-09-24 DIAGNOSIS — M955 Acquired deformity of pelvis: Secondary | ICD-10-CM | POA: Diagnosis not present

## 2017-10-18 DIAGNOSIS — E291 Testicular hypofunction: Secondary | ICD-10-CM | POA: Diagnosis not present

## 2017-11-01 DIAGNOSIS — E291 Testicular hypofunction: Secondary | ICD-10-CM | POA: Diagnosis not present

## 2017-12-31 ENCOUNTER — Encounter: Payer: Self-pay | Admitting: Family Medicine

## 2017-12-31 ENCOUNTER — Ambulatory Visit (INDEPENDENT_AMBULATORY_CARE_PROVIDER_SITE_OTHER): Payer: BLUE CROSS/BLUE SHIELD | Admitting: Family Medicine

## 2017-12-31 VITALS — BP 138/80 | HR 64 | Ht 68.0 in | Wt 222.0 lb

## 2017-12-31 DIAGNOSIS — Z0000 Encounter for general adult medical examination without abnormal findings: Secondary | ICD-10-CM

## 2017-12-31 DIAGNOSIS — Z23 Encounter for immunization: Secondary | ICD-10-CM

## 2017-12-31 NOTE — Patient Instructions (Signed)
Calorie Counting for Weight Loss Calories are units of energy. Your body needs a certain amount of calories from food to keep you going throughout the day. When you eat more calories than your body needs, your body stores the extra calories as fat. When you eat fewer calories than your body needs, your body burns fat to get the energy it needs. Calorie counting means keeping track of how many calories you eat and drink each day. Calorie counting can be helpful if you need to lose weight. If you make sure to eat fewer calories than your body needs, you should lose weight. Ask your health care provider what a healthy weight is for you. For calorie counting to work, you will need to eat the right number of calories in a day in order to lose a healthy amount of weight per week. A dietitian can help you determine how many calories you need in a day and will give you suggestions on how to reach your calorie goal.  A healthy amount of weight to lose per week is usually 1-2 lb (0.5-0.9 kg). This usually means that your daily calorie intake should be reduced by 500-750 calories.  Eating 1,200 - 1,500 calories per day can help most women lose weight.  Eating 1,500 - 1,800 calories per day can help most men lose weight.  What is my plan? My goal is to have __________ calories per day. If I have this many calories per day, I should lose around __________ pounds per week. What do I need to know about calorie counting? In order to meet your daily calorie goal, you will need to:  Find out how many calories are in each food you would like to eat. Try to do this before you eat.  Decide how much of the food you plan to eat.  Write down what you ate and how many calories it had. Doing this is called keeping a food log.  To successfully lose weight, it is important to balance calorie counting with a healthy lifestyle that includes regular activity. Aim for 150 minutes of moderate exercise (such as walking) or 75  minutes of vigorous exercise (such as running) each week. Where do I find calorie information?  The number of calories in a food can be found on a Nutrition Facts label. If a food does not have a Nutrition Facts label, try to look up the calories online or ask your dietitian for help. Remember that calories are listed per serving. If you choose to have more than one serving of a food, you will have to multiply the calories per serving by the amount of servings you plan to eat. For example, the label on a package of bread might say that a serving size is 1 slice and that there are 90 calories in a serving. If you eat 1 slice, you will have eaten 90 calories. If you eat 2 slices, you will have eaten 180 calories. How do I keep a food log? Immediately after each meal, record the following information in your food log:  What you ate. Don't forget to include toppings, sauces, and other extras on the food.  How much you ate. This can be measured in cups, ounces, or number of items.  How many calories each food and drink had.  The total number of calories in the meal.  Keep your food log near you, such as in a small notebook in your pocket, or use a mobile app or website. Some   programs will calculate calories for you and show you how many calories you have left for the day to meet your goal. What are some calorie counting tips?  Use your calories on foods and drinks that will fill you up and not leave you hungry: ? Some examples of foods that fill you up are nuts and nut butters, vegetables, lean proteins, and high-fiber foods like whole grains. High-fiber foods are foods with more than 5 g fiber per serving. ? Drinks such as sodas, specialty coffee drinks, alcohol, and juices have a lot of calories, yet do not fill you up.  Eat nutritious foods and avoid empty calories. Empty calories are calories you get from foods or beverages that do not have many vitamins or protein, such as candy, sweets, and  soda. It is better to have a nutritious high-calorie food (such as an avocado) than a food with few nutrients (such as a bag of chips).  Know how many calories are in the foods you eat most often. This will help you calculate calorie counts faster.  Pay attention to calories in drinks. Low-calorie drinks include water and unsweetened drinks.  Pay attention to nutrition labels for "low fat" or "fat free" foods. These foods sometimes have the same amount of calories or more calories than the full fat versions. They also often have added sugar, starch, or salt, to make up for flavor that was removed with the fat.  Find a way of tracking calories that works for you. Get creative. Try different apps or programs if writing down calories does not work for you. What are some portion control tips?  Know how many calories are in a serving. This will help you know how many servings of a certain food you can have.  Use a measuring cup to measure serving sizes. You could also try weighing out portions on a kitchen scale. With time, you will be able to estimate serving sizes for some foods.  Take some time to put servings of different foods on your favorite plates, bowls, and cups so you know what a serving looks like.  Try not to eat straight from a bag or box. Doing this can lead to overeating. Put the amount you would like to eat in a cup or on a plate to make sure you are eating the right portion.  Use smaller plates, glasses, and bowls to prevent overeating.  Try not to multitask (for example, watch TV or use your computer) while eating. If it is time to eat, sit down at a table and enjoy your food. This will help you to know when you are full. It will also help you to be aware of what you are eating and how much you are eating. What are tips for following this plan? Reading food labels  Check the calorie count compared to the serving size. The serving size may be smaller than what you are used to  eating.  Check the source of the calories. Make sure the food you are eating is high in vitamins and protein and low in saturated and trans fats. Shopping  Read nutrition labels while you shop. This will help you make healthy decisions before you decide to purchase your food.  Make a grocery list and stick to it. Cooking  Try to cook your favorite foods in a healthier way. For example, try baking instead of frying.  Use low-fat dairy products. Meal planning  Use more fruits and vegetables. Half of your plate should   be fruits and vegetables.  Include lean proteins like poultry and fish. How do I count calories when eating out?  Ask for smaller portion sizes.  Consider sharing an entree and sides instead of getting your own entree.  If you get your own entree, eat only half. Ask for a box at the beginning of your meal and put the rest of your entree in it so you are not tempted to eat it.  If calories are listed on the menu, choose the lower calorie options.  Choose dishes that include vegetables, fruits, whole grains, low-fat dairy products, and lean protein.  Choose items that are boiled, broiled, grilled, or steamed. Stay away from items that are buttered, battered, fried, or served with cream sauce. Items labeled "crispy" are usually fried, unless stated otherwise.  Choose water, low-fat milk, unsweetened iced tea, or other drinks without added sugar. If you want an alcoholic beverage, choose a lower calorie option such as a glass of wine or light beer.  Ask for dressings, sauces, and syrups on the side. These are usually high in calories, so you should limit the amount you eat.  If you want a salad, choose a garden salad and ask for grilled meats. Avoid extra toppings like bacon, cheese, or fried items. Ask for the dressing on the side, or ask for olive oil and vinegar or lemon to use as dressing.  Estimate how many servings of a food you are given. For example, a serving of  cooked rice is  cup or about the size of half a baseball. Knowing serving sizes will help you be aware of how much food you are eating at restaurants. The list below tells you how big or small some common portion sizes are based on everyday objects: ? 1 oz-4 stacked dice. ? 3 oz-1 deck of cards. ? 1 tsp-1 die. ? 1 Tbsp- a ping-pong ball. ? 2 Tbsp-1 ping-pong ball. ?  cup- baseball. ? 1 cup-1 baseball. Summary  Calorie counting means keeping track of how many calories you eat and drink each day. If you eat fewer calories than your body needs, you should lose weight.  A healthy amount of weight to lose per week is usually 1-2 lb (0.5-0.9 kg). This usually means reducing your daily calorie intake by 500-750 calories.  The number of calories in a food can be found on a Nutrition Facts label. If a food does not have a Nutrition Facts label, try to look up the calories online or ask your dietitian for help.  Use your calories on foods and drinks that will fill you up, and not on foods and drinks that will leave you hungry.  Use smaller plates, glasses, and bowls to prevent overeating. This information is not intended to replace advice given to you by your health care provider. Make sure you discuss any questions you have with your health care provider. Document Released: 03/20/2005 Document Revised: 02/18/2016 Document Reviewed: 02/18/2016 Elsevier Interactive Patient Education  2018 [employer]  LOW-CHOLESTEROL, LOW-TRIGLYCERIDE DIETS    FOODS TO USE   MEATS, FISH Choose lean meats (chicken, [place], veal, and non-fatty cuts of beef with excess fat trimmed; one serving = 3 oz of cooked meat). Also, fresh or frozen fish, canned fish packed in water, and shellfish (lobster, crabs, shrimp, and oysters). Limit use to no more than one serving of one of these per week. Shellfish are high in cholesterol but low in saturated fat and should be used sparingly. Meats  and fish should  be broiled (pan or oven) or baked on a rack.  EGGS Egg substitutes and egg whites (use freely). Egg yolks (limit two per week).  FRUITS Eat three servings of fresh fruit per day (1 serving =  cup). Be sure to have at least one citrus fruit daily. Frozen and canned fruit with no sugar or syrup added may be used.  VEGETABLES Most vegetables are not limited (see next page). One dark-green (string beans, escarole) or one deep yellow (squash) vegetable is recommended daily. Cauliflower, broccoli, and celery, as well as potato skins, are recommended for their fiber content. (Fiber is associated with cholesterol reduction) It is preferable to steam vegetables, but they may be boiled, strained, or braised with polyunsaturated vegetable oil (see below).  BEANS Dried peas or beans (1 serving =  cup) may be used as a bread substitute.  NUTS Almonds, walnuts, and peanuts may be used sparingly  (1 serving = 1 Tablespoonful). Use pumpkin, sesame, or sunflower seeds.  BREADS, GRAINS One roll or one slice of whole grain or enriched bread may be used, or three soda crackers or four pieces of melba toast as a substitute. Spaghetti, rice or noodles ( cup) or  large ear of corn may be used as a bread substitute. In preparing these foods do not use butter or shortening, use soft margarine. Also use egg and sugar substitutes.  Choose high fiber grains, such as oats and whole wheat.  CEREALS Use  cup of hot cereal or  cup of cold cereal per day. Add a sugar substitute if desired, with 99% fat free or skim milk.  MILK PRODUCTS Always use 99% fat free or skim milk, dairy products such as low fat cheeses (farmer's uncreamed diet cottage), low-fat yogurt, and powdered skim milk.  FATS, OILS Use soft (not stick) margarine; vegetable oils that are high in polyunsaturated fats (such as safflower, sunflower, soybean, corn, and cottonseed). Always refrigerate meat drippings to harden the fat and remove it before preparing gravies    DESSERTS, SNACKS Limit to two servings per day; substitute each serving for a bread/cereal serving: ice milk, water sherbet (1/4 cup); unflavored gelatin or gelatin flavored with sugar substitute (1/3 cup); pudding prepared with skim milk (1/2 cup); egg white souffls; unbuttered popcorn (1  cups). Substitute carob for chocolate.  BEVERAGES Fresh fruit juices (limit 4 oz per day); black coffee, plain or herbal teas; soft drinks with sugar substitutes; club soda, preferably salt-free; cocoa made with skim milk or nonfat dried milk and water (sugar substitute added if desired); clear broth. Alcohol: limit two servings per day (see second page).  MISCELLANEOUS  You may use the following freely: vinegar, spices, herbs, nonfat bouillon, mustard, Worcestershire sauce, soy sauce, flavoring essence.                  GUIDELINES FOR  LOW-CHOLESTEROL, LOW TRIGLYCERIDE DIETS    FOODS TO AVOID   MEATS, FISH Marbled beef, pork, bacon, sausage, and other pork products; fatty fowl (duck, goose); skin and fat of [place] and chicken; processed meats; luncheon meats (salami, bologna); frankfurters and fast-food hamburgers (theyre loaded with fat); organ meats (kidneys, liver); canned fish packed in oil.  EGGS Limit egg yolks to two per week.   FRUITS Coconuts (rich in saturated fats).  VEGETABLES Avoid avocados. Starchy vegetables (potatoes, corn, lima beans, dried peas, beans) may be used only if substitutes for a serving of bread or cereal. (Baked potato skin, however, is desirable for its  fiber content.  BEANS Commercial baked beans with sugar and/or pork added.  NUTS Avoid nuts.  Limit peanuts and walnuts to one tablespoonful per day.  BREADS, GRAINS Any baked goods with shortening and/or sugar. Commercial mixes with dried eggs and whole milk. Avoid sweet rolls, doughnuts, breakfast pastries (Danish), and sweetened packaged cereals (the added sugar converts readily to triglycerides).  MILK  PRODUCTS Whole milk and whole-milk packaged goods; cream; ice cream; whole-milk puddings, yogurt, or cheeses; nondairy cream substitutes.  FATS, OILS Butter, lard, animal fats, bacon drippings, gravies, cream sauces as well as palm and coconut oils. All these are high in saturated fats. Examine labels on cholesterol free products for hydrogenated fats. (These are oils that have been hardened into solids and in the process have become saturated.)  DESSERTS, SNACKS Fried snack foods like potato chips; chocolate; candies in general; jams, jellies, syrups; whole- milk puddings; ice cream and milk sherbets; hydrogenated peanut butter.  BEVERAGES Sugared fruit juices and soft drinks; cocoa made with whole milk and/or sugar. When using alcohol (1 oz liquor, 5 oz beer, or 2  oz dry table wine per serving), one serving must be substituted for one bread or cereal serving (limit, two servings of alcohol per day).   SPECIAL NOTES    1. Remember that even non-limited foods should be used in moderation. 2. While on a cholesterol-lowering diet, be sure to avoid animal fats and marbled meats. 3. 3. While on a triglyceride-lowering diet, be sure to avoid sweets and to control the amount of carbohydrates you eat (starchy foods such as flour, bread, potatoes).While on a tri-glyceride-lowering diet, be sure to avoid sweets 4. Buy a good low-fat cookbook, such as the one published by the American Heart Association. 5. Consult your physician if you have any questions.               [facility] Low Glycemic Diet Plan   Low Glycemic Foods (20-49) Moderate Glycemic Foods (50-69) High Glycemic Foods (70-100)      Breakfast Creals Breakfast Cereals Breakfast Cereals  All Bran All-Bran Fruit'n Oats   Bran Buds Bran Chex   Cheerios Corn chex    Fiber One Oatmeal (not instant)   Just Right Mini-Wheats   Corn Flakes Cream of Wheat    Oat Bran Special K Swiss Muesli   Grape Nuts Grape Nut Flakes       Grits Nutri-Grain    Fruits and fruit juice: Fruits Puffed Rice Puffed Wheat    (Limit to 1-2 Servings per day) Banana (under-ride) Dates   Rice Chex Rice Krispies    Apples Apricots (fresh/dried)   Figs Grapes   Shredded Wheat Team    Blackberries Blueberries   Kiwi Mango   Total     Cherries Cranberries   Oranges Raisins     Peaches Pears    Fruits  Plums Prunes   Fruit Juices Pineapple Watermelon    Grapefruit Raspberries   Cranberry Juice Orange Juice   Banana (over-ripe)     Strawberries Tangerines      Apple Juice Grapefruit Juice   Beans and Legumes Beverages  Tomato Juice    [place]-type baked beans Sodas, sweet tea, pineapple juice   Canned pinto, kidney, or navy beans   Beans and Legumes (fresh-cooked) Green peas Vegetables  Black-eyed peas Butter Beans    Potato, baked, boiled, fried, mashed  Chick peas Lentils   Vegetables [place] fries  Green beans Lima beans   Beets Carrots  Canned or frozen corn  Kidney beans Navy beans   Sweet potato Yam   Parsnips  Pinto beans Snow peas   Corn on the cob Winter squash      Non-starchy vegetables Grains Breads  Asparagus, avocado, broccoli, cabbage Cornmeal Rice, brown   Most breads (white and whole grain)  cauliflower, celery, cucumber, greens Rice, white Couscous   Bagels Bread sticks    lettuce, mushrooms, peppers, tomatoes  Bread stuffing Kaiser roll    okra, onions, spinach, summer squash Pasta Dinner rolls   [employer], cheese     Grains Ravioli, meat filled Spaghetti, white   Grains  Barley Bulgur    Rice, instant Tapioca, with milk    Rye Wild rice   Nuts    Cashews Macadamia   Candy and most cookies  Nuts and oils    Almonds, peanuts, sunflower seeds Snacks Snacks  hazelnuts, pecans, walnuts Chocolate Ice cream, lowfat   Donuts Corn chips    Oils that are liquid at room temperature Muffin Popcorn   Jelly beans Pretzels      Pastries  Dairy, fish, meat, soy, and eggs    Milk,  skim Lowfat cheese    Restaurant and ethnic foods  Yogurt, lowfat, fruit sugar sweetened  Most [place] food (sugar in stir fry    or wok sauce)  Lean red meat Fish    Teriyaki-style meats and vegetables  Skinless chicken and [place], shellfish        Egg whites (up to 3 daily), Soy Products    Egg yolks (up to 7 or _____ per week)

## 2017-12-31 NOTE — Progress Notes (Signed)
Date:  12/31/2017   Name:  Johnny Liu   DOB:  [birth year]   MRN:  [id number]   Chief Complaint: Annual Exam (glucose and lipid check) Patient is a 52 year old male who presents for a insurance physical exam. The patient reports the following problems: form filled out. Health maintenance has been reviewed up to date.    Review of Systems  Constitutional: Negative for appetite change, chills, fatigue, fever and unexpected weight change.  HENT: Negative for drooling, ear discharge, ear pain, facial swelling, hearing loss, nosebleeds, sneezing, sore throat and trouble swallowing.   Eyes: Negative for photophobia, pain, discharge, redness, itching and visual disturbance.  Respiratory: Negative for cough, choking, chest tightness, shortness of breath and wheezing.   Cardiovascular: Negative for chest pain, palpitations and leg swelling.  Gastrointestinal: Negative for abdominal pain, blood in stool, constipation, diarrhea, nausea, rectal pain and vomiting.  Endocrine: Negative for cold intolerance, heat intolerance, polydipsia, polyphagia and polyuria.  Genitourinary: Negative for decreased urine volume, difficulty urinating, discharge, dysuria, flank pain, frequency, hematuria, penile pain, penile swelling, scrotal swelling, testicular pain and urgency.  Musculoskeletal: Negative for back pain, joint swelling, myalgias, neck pain and neck stiffness.  Skin: Negative for color change and rash.  Allergic/Immunologic: Negative for environmental allergies and immunocompromised state.  Neurological: Negative for dizziness, tremors, seizures, syncope, speech difficulty, weakness, light-headedness, numbness and headaches.  Hematological: Does not bruise/bleed easily.  Psychiatric/Behavioral: Negative for agitation, behavioral problems, confusion, dysphoric mood, hallucinations, self-injury and suicidal ideas. The patient is not nervous/anxious.     Patient Active Problem List   Diagnosis Date  Noted  . Heartburn   . Problems with swallowing and mastication   . Gastritis without bleeding   . Stricture and stenosis of esophagus   . Abnormal feces   . Benign neoplasm of cecum   . Polyp of sigmoid colon   . Chronic prostatitis 09/23/2012  . Hypertrophy of prostate with urinary obstruction and other lower urinary tract symptoms (LUTS) 09/23/2012  . 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 Other specified disorder of male genital organs(608.89) 09/23/2012    No Known Allergies  Past Surgical History:  Procedure Laterality Date  . APPENDECTOMY    . COLONOSCOPY WITH PROPOFOL N/A 08/31/2016   Procedure: COLONOSCOPY WITH PROPOFOL;  Surgeon: Lucilla Lame, MD;  Location: [place];  Service: Endoscopy;  Laterality: N/A;  . ESOPHAGEAL DILATION  08/31/2016   Procedure: ESOPHAGEAL DILATION;  Surgeon: Lucilla Lame, MD;  Location: [place];  Service: Endoscopy;;  . ESOPHAGOGASTRODUODENOSCOPY (EGD) WITH PROPOFOL N/A 08/31/2016   Procedure: ESOPHAGOGASTRODUODENOSCOPY (EGD) WITH PROPOFOL;  Surgeon: Lucilla Lame, MD;  Location: [place];  Service: Endoscopy;  Laterality: N/A;    Social History   Tobacco Use  . Smoking status: Never Smoker  . Smokeless tobacco: Never Used  Substance Use Topics  . Alcohol use: Yes    Alcohol/week: 7.0 standard drinks    Types: 7 Cans of beer per week  . Drug use: No     Medication list has been reviewed and updated.  Current Meds  Medication Sig  . Multiple Vitamin (MULTIVITAMIN) tablet Take 1 tablet by mouth daily.    PHQ 2/9 Scores 12/31/2017 05/04/2017  PHQ - 2 Score 0 0  PHQ- 9 Score 0 0    Physical Exam  Constitutional: He is oriented to person, place, and time.  HENT:  Head: Normocephalic.  Right Ear:  External ear normal.  Left Ear: External ear normal.  Nose: Nose normal.  Mouth/Throat: Oropharynx is clear and moist.  Eyes: Pupils  are equal, round, and reactive to light. Conjunctivae and EOM are normal. Right eye exhibits no discharge. Left eye exhibits no discharge. No scleral icterus.  Neck: Normal range of motion. Neck supple. No JVD present. No tracheal deviation present. No thyromegaly present.  Cardiovascular: Normal rate, regular rhythm, normal heart sounds and intact distal pulses. Exam reveals no gallop and no friction rub.  No murmur heard. Pulmonary/Chest: Breath sounds normal. No respiratory distress. He has no wheezes. He has no rales.  Abdominal: Soft. Bowel sounds are normal. He exhibits no mass. There is no hepatosplenomegaly. There is no tenderness. There is no rebound, no guarding and no CVA tenderness.  Musculoskeletal: Normal range of motion. He exhibits no edema or tenderness.  Lymphadenopathy:    He has no cervical adenopathy.  Neurological: He is alert and oriented to person, place, and time. He has normal strength and normal reflexes. No cranial nerve deficit.  Skin: Skin is warm. No rash noted.  Nursing note and vitals reviewed.   BP 138/80   Pulse 64   Ht 5\' 8"  (1.727 m)   Wt 222 lb (100.7 kg)   BMI 33.75 kg/m   Assessment and Plan:  1. Physical exam, routine Patient's exam was normal/ draw chol and glucose for paperwork - Cholesterol, Total - Glucose  2. Flu vaccine need administered - Flu Vaccine QUAD 6+ mos PF IM (Fluarix Quad PF)  Dr. Otilio Miu [facility]  12/31/2017

## 2018-01-01 LAB — GLUCOSE, RANDOM: GLUCOSE: 85 mg/dL (ref 65–99)

## 2018-01-01 LAB — CHOLESTEROL, TOTAL: CHOLESTEROL TOTAL: 144 mg/dL (ref 100–199)

## 2018-01-21 DIAGNOSIS — N529 Male erectile dysfunction, unspecified: Secondary | ICD-10-CM | POA: Diagnosis not present

## 2018-01-21 DIAGNOSIS — E291 Testicular hypofunction: Secondary | ICD-10-CM | POA: Diagnosis not present

## 2018-01-21 DIAGNOSIS — Z6834 Body mass index (BMI) 34.0-34.9, adult: Secondary | ICD-10-CM | POA: Diagnosis not present

## 2018-01-22 DIAGNOSIS — R4702 Dysphasia: Secondary | ICD-10-CM | POA: Diagnosis not present

## 2018-01-22 DIAGNOSIS — T18128A Food in esophagus causing other injury, initial encounter: Secondary | ICD-10-CM | POA: Diagnosis not present

## 2018-01-22 DIAGNOSIS — J029 Acute pharyngitis, unspecified: Secondary | ICD-10-CM | POA: Diagnosis not present

## 2018-01-22 DIAGNOSIS — Z79899 Other long term (current) drug therapy: Secondary | ICD-10-CM | POA: Diagnosis not present

## 2018-01-22 DIAGNOSIS — Z6834 Body mass index (BMI) 34.0-34.9, adult: Secondary | ICD-10-CM | POA: Diagnosis not present

## 2018-01-22 DIAGNOSIS — R0789 Other chest pain: Secondary | ICD-10-CM | POA: Diagnosis not present

## 2018-01-22 DIAGNOSIS — Z8249 Family history of ischemic heart disease and other diseases of the circulatory system: Secondary | ICD-10-CM | POA: Diagnosis not present

## 2018-01-22 DIAGNOSIS — E291 Testicular hypofunction: Secondary | ICD-10-CM | POA: Diagnosis not present

## 2018-01-22 DIAGNOSIS — T18108A Unspecified foreign body in esophagus causing other injury, initial encounter: Secondary | ICD-10-CM | POA: Diagnosis not present

## 2018-01-22 DIAGNOSIS — K222 Esophageal obstruction: Secondary | ICD-10-CM | POA: Diagnosis not present

## 2018-01-23 DIAGNOSIS — T18128A Food in esophagus causing other injury, initial encounter: Secondary | ICD-10-CM | POA: Diagnosis not present

## 2018-01-23 DIAGNOSIS — K222 Esophageal obstruction: Secondary | ICD-10-CM | POA: Diagnosis not present

## 2018-02-12 DIAGNOSIS — Z1283 Encounter for screening for malignant neoplasm of skin: Secondary | ICD-10-CM | POA: Diagnosis not present

## 2018-02-12 DIAGNOSIS — L570 Actinic keratosis: Secondary | ICD-10-CM | POA: Diagnosis not present

## 2018-02-12 DIAGNOSIS — L578 Other skin changes due to chronic exposure to nonionizing radiation: Secondary | ICD-10-CM | POA: Diagnosis not present

## 2018-02-12 DIAGNOSIS — L720 Epidermal cyst: Secondary | ICD-10-CM | POA: Diagnosis not present

## 2018-02-12 DIAGNOSIS — L814 Other melanin hyperpigmentation: Secondary | ICD-10-CM | POA: Diagnosis not present

## 2018-03-21 ENCOUNTER — Ambulatory Visit: Payer: Self-pay | Admitting: Family Medicine

## 2018-03-22 ENCOUNTER — Ambulatory Visit (INDEPENDENT_AMBULATORY_CARE_PROVIDER_SITE_OTHER): Payer: BLUE CROSS/BLUE SHIELD | Admitting: Family Medicine

## 2018-03-22 ENCOUNTER — Encounter: Payer: Self-pay | Admitting: Family Medicine

## 2018-03-22 VITALS — BP 124/80 | HR 80 | Ht 68.0 in | Wt 225.0 lb

## 2018-03-22 DIAGNOSIS — N342 Other urethritis: Secondary | ICD-10-CM | POA: Diagnosis not present

## 2018-03-22 MED ORDER — DOXYCYCLINE HYCLATE 100 MG PO TABS: 100.0000 mg | ORAL_TABLET | Freq: Two times a day (BID) | ORAL | 0 refills | Status: DC

## 2018-03-22 MED ORDER — FLUCONAZOLE 150 MG PO TABS: 150.0000 mg | ORAL_TABLET | Freq: Once | ORAL | 0 refills | Status: DC

## 2018-03-22 NOTE — Progress Notes (Signed)
Date:  03/22/2018   Name:  Johnny Liu   DOB:  [birth year]   MRN:  [id number]   Chief Complaint: Penis Pain (not so much pain, just irritation)  Penis Pain  The patient's primary symptoms include penile pain. The patient's pertinent negatives include no genital injury, genital itching, genital lesions, pelvic pain, penile discharge, priapism, scrotal swelling or testicular pain. Primary symptoms comment: mild dysuria. This is a chronic problem. The current episode started more than 1 month ago. The problem occurs intermittently. The problem has been gradually worsening. The pain is mild. Associated symptoms include frequency and urgency. Pertinent negatives include no abdominal pain, anorexia, chest pain, chills, constipation, coughing, diarrhea, discolored urine, dysuria, fever, flank pain, headaches, hematuria, hesitancy, joint pain, joint swelling, nausea, painful intercourse, rash, shortness of breath, sore throat, urinary retention or vomiting. Nothing aggravates the symptoms. He has tried nothing for the symptoms. The treatment provided mild relief. He is sexually active.    Review of Systems  Constitutional: Negative for chills and fever.  HENT: Negative for drooling, ear discharge, ear pain and sore throat.   Respiratory: Negative for cough, shortness of breath and wheezing.   Cardiovascular: Negative for chest pain, palpitations and leg swelling.  Gastrointestinal: Negative for abdominal pain, anorexia, blood in stool, constipation, diarrhea, nausea and vomiting.  Endocrine: Negative for polydipsia.  Genitourinary: Positive for frequency, penile pain and urgency. Negative for discharge, dysuria, flank pain, hematuria, hesitancy, pelvic pain, scrotal swelling and testicular pain.  Musculoskeletal: Negative for back pain, joint pain, myalgias and neck pain.  Skin: Negative for rash.  Allergic/Immunologic: Negative for environmental allergies.  Neurological: Negative for dizziness  and headaches.  Hematological: Does not bruise/bleed easily.  Psychiatric/Behavioral: Negative for suicidal ideas. The patient is not nervous/anxious.     Patient Active Problem List   Diagnosis Date Noted  . Heartburn   . Problems with swallowing and mastication   . Gastritis without bleeding   . Stricture and stenosis of esophagus   . Abnormal feces   . Benign neoplasm of cecum   . Polyp of sigmoid colon   . Chronic prostatitis 09/23/2012  . Hypertrophy of prostate with urinary obstruction and other lower urinary tract symptoms (LUTS) 09/23/2012  . 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 Other specified disorder of male genital organs(608.89) 09/23/2012    No Known Allergies  Past Surgical History:  Procedure Laterality Date  . APPENDECTOMY    . COLONOSCOPY WITH PROPOFOL N/A 08/31/2016   Procedure: COLONOSCOPY WITH PROPOFOL;  Surgeon: Lucilla Lame, MD;  Location: [place];  Service: Endoscopy;  Laterality: N/A;  . ESOPHAGEAL DILATION  08/31/2016   Procedure: ESOPHAGEAL DILATION;  Surgeon: Lucilla Lame, MD;  Location: [place];  Service: Endoscopy;;  . ESOPHAGOGASTRODUODENOSCOPY (EGD) WITH PROPOFOL N/A 08/31/2016   Procedure: ESOPHAGOGASTRODUODENOSCOPY (EGD) WITH PROPOFOL;  Surgeon: Lucilla Lame, MD;  Location: [place];  Service: Endoscopy;  Laterality: N/A;    Social History   Tobacco Use  . Smoking status: Never Smoker  . Smokeless tobacco: Never Used  Substance Use Topics  . Alcohol use: Yes    Alcohol/week: 7.0 standard drinks    Types: 7 Cans of beer per week  . Drug use: No     Medication list has been reviewed and updated.  Current Meds  Medication Sig  . Multiple Vitamin (MULTIVITAMIN) tablet Take 1 tablet by mouth daily.    PHQ 2/9  Scores 12/31/2017 05/04/2017  PHQ - 2 Score 0 0  PHQ- 9 Score 0 0    Physical Exam Vitals signs and nursing note  reviewed.  Constitutional:      Appearance: Normal appearance.  HENT:     Head: Normocephalic.     Right Ear: External ear normal.     Left Ear: External ear normal.     Nose: Nose normal.  Eyes:     General: No scleral icterus.       Right eye: No discharge.        Left eye: No discharge.     Conjunctiva/sclera: Conjunctivae normal.     Pupils: Pupils are equal, round, and reactive to light.  Neck:     Musculoskeletal: Normal range of motion and neck supple.     Thyroid: No thyromegaly.     Vascular: No JVD.     Trachea: No tracheal deviation.  Cardiovascular:     Rate and Rhythm: Normal rate and regular rhythm.     Heart sounds: Normal heart sounds. No murmur. No friction rub. No gallop.   Pulmonary:     Effort: No respiratory distress.     Breath sounds: Normal breath sounds. No wheezing or rales.  Abdominal:     General: Bowel sounds are normal.     Palpations: Abdomen is soft. There is no mass.     Tenderness: There is no abdominal tenderness. There is no guarding or rebound.  Genitourinary:    Pubic Area: No rash.      Penis: Erythema present.      Scrotum/Testes: Normal.        Right: Mass not present.        Left: Mass not present.     Comments: Mild urethral erythema Musculoskeletal: Normal range of motion.        General: No tenderness.  Lymphadenopathy:     Cervical: No cervical adenopathy.  Skin:    General: Skin is warm.     Findings: No rash.  Neurological:     Mental Status: He is alert and oriented to person, place, and time.     Cranial Nerves: No cranial nerve deficit.     Deep Tendon Reflexes: Reflexes are normal and symmetric.     BP 124/80   Pulse 80   Ht 5\' 8"  (1.727 m)   Wt 225 lb (102.1 kg)   BMI 34.21 kg/m   Assessment and Plan:  1. Urethritis New onset.  Area of dysuria and erythema in periurethral area.  No charge noted patient was treated with Diflucan 150 mg 1 immediately repeat in 2 weeks and doxycycline 100 mg twice a day for  10 days - fluconazole (DIFLUCAN) 150 MG tablet; Take 1 tablet (150 mg total) by mouth once for 1 dose.  Dispense: 2 tablet; Refill: 0 - doxycycline (VIBRA-TABS) 100 MG tablet; Take 1 tablet (100 mg total) by mouth 2 (two) times daily.  Dispense: 20 tablet; Refill: 0

## 2018-06-03 ENCOUNTER — Ambulatory Visit (INDEPENDENT_AMBULATORY_CARE_PROVIDER_SITE_OTHER): Payer: BLUE CROSS/BLUE SHIELD | Admitting: Family Medicine

## 2018-06-03 ENCOUNTER — Encounter: Payer: Self-pay | Admitting: Family Medicine

## 2018-06-03 ENCOUNTER — Other Ambulatory Visit: Payer: Self-pay

## 2018-06-03 VITALS — BP 132/82 | HR 88 | Resp 16 | Ht 68.0 in | Wt 226.0 lb

## 2018-06-03 DIAGNOSIS — J4521 Mild intermittent asthma with (acute) exacerbation: Secondary | ICD-10-CM

## 2018-06-03 DIAGNOSIS — R05 Cough: Secondary | ICD-10-CM

## 2018-06-03 DIAGNOSIS — R059 Cough, unspecified: Secondary | ICD-10-CM

## 2018-06-03 DIAGNOSIS — J0100 Acute maxillary sinusitis, unspecified: Secondary | ICD-10-CM | POA: Diagnosis not present

## 2018-06-03 MED ORDER — ALBUTEROL SULFATE 108 (90 BASE) MCG/ACT IN AEPB: 1.0000 | INHALATION_SPRAY | Freq: Two times a day (BID) | RESPIRATORY_TRACT | 1 refills | Status: DC

## 2018-06-03 MED ORDER — GUAIFENESIN-CODEINE 100-10 MG/5ML PO SYRP: 5.0000 mL | ORAL_SOLUTION | Freq: Four times a day (QID) | ORAL | 0 refills | Status: DC | PRN

## 2018-06-03 MED ORDER — AZITHROMYCIN 250 MG PO TABS: ORAL_TABLET | ORAL | 0 refills | Status: DC

## 2018-06-03 NOTE — Progress Notes (Signed)
Date:  06/03/2018   Name:  Johnny Liu   DOB:  May 11, 1965   MRN:  [id number]   Chief Complaint: Cough (7 days of cougha nd congestion no fever )  Cough  This is a new problem. The current episode started 1 to 4 weeks ago (2 weeks). The problem has been unchanged. The problem occurs constantly. The cough is productive of purulent sputum. Associated symptoms include postnasal drip. Pertinent negatives include no chest pain, chills, ear congestion, ear pain, fever, headaches, heartburn, hemoptysis, myalgias, nasal congestion, rash, rhinorrhea, sore throat, shortness of breath, sweats, weight loss or wheezing. Nothing aggravates the symptoms. He has tried OTC cough suppressant for the symptoms. The treatment provided no relief. There is no history of asthma, bronchiectasis, COPD, emphysema, environmental allergies or pneumonia.  Sinusitis  This is a chronic problem. The current episode started more than 1 year ago. The problem has been gradually worsening since onset. The maximum temperature recorded prior to his arrival was 100.4 - 100.9 F (low grade). Associated symptoms include congestion, coughing, sinus pressure and sneezing. Pertinent negatives include no chills, diaphoresis, ear pain, headaches, neck pain, shortness of breath, sore throat or swollen glands. The treatment provided moderate relief.    Review of Systems  Constitutional: Negative for chills, diaphoresis, fever and weight loss.  HENT: Positive for congestion, postnasal drip, sinus pressure, sinus pain and sneezing. Negative for drooling, ear discharge, ear pain, rhinorrhea and sore throat.   Respiratory: Positive for cough. Negative for hemoptysis, shortness of breath and wheezing.   Cardiovascular: Negative for chest pain, palpitations and leg swelling.  Gastrointestinal: Negative for abdominal pain, blood in stool, constipation, diarrhea, heartburn and nausea.  Endocrine: Negative for polydipsia.  Genitourinary: Negative for  dysuria, frequency, hematuria and urgency.  Musculoskeletal: Negative for back pain, myalgias and neck pain.  Skin: Negative for rash.  Allergic/Immunologic: Negative for environmental allergies.  Neurological: Negative for dizziness and headaches.  Hematological: Does not bruise/bleed easily.  Psychiatric/Behavioral: Negative for suicidal ideas. The patient is not nervous/anxious.     Patient Active Problem List   Diagnosis Date Noted  . Heartburn   . Problems with swallowing and mastication   . Gastritis without bleeding   . Stricture and stenosis of esophagus   . Abnormal feces   . Benign neoplasm of cecum   . Polyp of sigmoid colon   . Chronic prostatitis 09/23/2012  . Hypertrophy of prostate with urinary obstruction and other lower urinary tract symptoms (LUTS) 09/23/2012  . 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 Other specified disorder of male genital organs(608.89) 09/23/2012    No Known Allergies  Past Surgical History:  Procedure Laterality Date  . APPENDECTOMY    . COLONOSCOPY WITH PROPOFOL N/A 08/31/2016   Procedure: COLONOSCOPY WITH PROPOFOL;  Surgeon: Lucilla Lame, MD;  Location: [facility];  Service: Endoscopy;  Laterality: N/A;  . ESOPHAGEAL DILATION  08/31/2016   Procedure: ESOPHAGEAL DILATION;  Surgeon: Lucilla Lame, MD;  Location: [place];  Service: Endoscopy;;  . ESOPHAGOGASTRODUODENOSCOPY (EGD) WITH PROPOFOL N/A 08/31/2016   Procedure: ESOPHAGOGASTRODUODENOSCOPY (EGD) WITH PROPOFOL;  Surgeon: Lucilla Lame, MD;  Location: [place]  Service: Endoscopy;  Laterality: N/A;    Social History   Tobacco Use  . Smoking status: Never Smoker  . Smokeless tobacco: Never Used  Substance Use Topics  . Alcohol use: Yes    Alcohol/week: 7.0 standard drinks    Types:  7 Cans of beer per week  . Drug use: No     Medication list has been reviewed and  updated.  Current Meds  Medication Sig  . Multiple Vitamin (MULTIVITAMIN) tablet Take 1 tablet by mouth daily.    PHQ 2/9 Scores 12/31/2017 05/04/2017  PHQ - 2 Score 0 0  PHQ- 9 Score 0 0    Physical Exam Vitals signs and nursing note reviewed.  Constitutional:      Appearance: He is normal weight.  HENT:     Head: Normocephalic.     Jaw: There is normal jaw occlusion.     Right Ear: Tympanic membrane, ear canal and external ear normal. There is no impacted cerumen.     Left Ear: Tympanic membrane, ear canal and external ear normal. There is no impacted cerumen.     Nose: Nose normal.     Right Sinus: No maxillary sinus tenderness or frontal sinus tenderness.     Left Sinus: No maxillary sinus tenderness or frontal sinus tenderness.     Mouth/Throat:     Lips: Pink.     Mouth: Mucous membranes are moist.     Palate: No mass and lesions.     Pharynx: Oropharynx is clear.  Eyes:     General: No scleral icterus.       Right eye: No discharge.        Left eye: No discharge.     Conjunctiva/sclera: Conjunctivae normal.     Pupils: Pupils are equal, round, and reactive to light.  Neck:     Musculoskeletal: Normal range of motion and neck supple.     Thyroid: No thyromegaly.     Vascular: No carotid bruit or JVD.     Trachea: No tracheal deviation.  Cardiovascular:     Rate and Rhythm: Normal rate and regular rhythm.     Heart sounds: Normal heart sounds. No murmur. No friction rub. No gallop.   Pulmonary:     Effort: Pulmonary effort is normal. No respiratory distress.     Breath sounds: Normal breath sounds. No decreased breath sounds, wheezing, rhonchi or rales.     Comments: Increased e/i Abdominal:     General: Bowel sounds are normal.     Palpations: Abdomen is soft. There is no mass.     Tenderness: There is no abdominal tenderness. There is no guarding or rebound.  Musculoskeletal: Normal range of motion.        General: No tenderness.  Lymphadenopathy:      Cervical: No cervical adenopathy.  Skin:    General: Skin is warm.     Findings: No rash.  Neurological:     Mental Status: He is alert and oriented to person, place, and time.     Cranial Nerves: No cranial nerve deficit.     Deep Tendon Reflexes: Reflexes are normal and symmetric.     BP 132/82   Pulse 88   Resp 16   Ht 5\' 8"  (1.727 m)   Wt 226 lb (102.5 kg)   SpO2 98%   BMI 34.36 kg/m   Assessment and Plan:  1. Acute maxillary sinusitis, recurrence not specified Acute.  Persistent.  Initiate azithromycin 250 mg 2 today followed by 1 a day for 4 days. - azithromycin (ZITHROMAX) 250 MG tablet; 2 today then 1 a day for 4 days  Dispense: 6 tablet; Refill: 0  2. Cough Patient has a persistent cough that we will control with Robitussin-AC 1 teaspoon every 6  hours as needed cough. - guaiFENesin-codeine (ROBITUSSIN AC) 100-10 MG/5ML syrup; Take 5 mLs by mouth 4 (four) times daily as needed for cough.  Dispense: 118 mL; Refill: 0  3. Mild intermittent reactive airway disease with acute exacerbation Patient has been having some wheezing and there is an increase in expiratory to inspiratory ratio.  Will prescribe albuterol respite click for active airway disease. - Albuterol Sulfate (PROAIR RESPICLICK) 597 (90 Base) MCG/ACT AEPB; Inhale 1 puff into the lungs 2 (two) times daily.  Dispense: 1 each; Refill: 1

## 2018-10-18 DIAGNOSIS — E291 Testicular hypofunction: Secondary | ICD-10-CM | POA: Diagnosis not present

## 2018-10-18 DIAGNOSIS — N529 Male erectile dysfunction, unspecified: Secondary | ICD-10-CM | POA: Diagnosis not present

## 2019-03-29 ENCOUNTER — Encounter: Payer: Self-pay | Admitting: Emergency Medicine

## 2019-03-29 ENCOUNTER — Other Ambulatory Visit: Payer: Self-pay

## 2019-03-29 ENCOUNTER — Ambulatory Visit (INDEPENDENT_AMBULATORY_CARE_PROVIDER_SITE_OTHER): Payer: BLUE CROSS/BLUE SHIELD

## 2019-03-29 ENCOUNTER — Ambulatory Visit
Admission: EM | Admit: 2019-03-29 | Discharge: 2019-03-29 | Disposition: A | Discharge status: Home / Self Care | Payer: BLUE CROSS/BLUE SHIELD | Attending: Internal Medicine | Admitting: Internal Medicine

## 2019-03-29 DIAGNOSIS — S63502A Unspecified sprain of left wrist, initial encounter: Secondary | ICD-10-CM | POA: Diagnosis not present

## 2019-03-29 DIAGNOSIS — M25532 Pain in left wrist: Secondary | ICD-10-CM | POA: Diagnosis not present

## 2019-03-29 DIAGNOSIS — X500XXA Overexertion from strenuous movement or load, initial encounter: Secondary | ICD-10-CM | POA: Diagnosis not present

## 2019-03-29 MED ORDER — IBUPROFEN 600 MG PO TABS: 600.0000 mg | ORAL_TABLET | Freq: Four times a day (QID) | ORAL | 0 refills | Status: DC | PRN

## 2019-03-29 NOTE — ED Provider Notes (Signed)
[facility]    CSN: [id number] Arrival date & time: 03/29/19  1113      History   Chief Complaint Chief Complaint  Patient presents with  . Wrist Pain    left    HPI Johnny Liu is a 53 y.o. male with history of gastroesophageal reflux disease comes to urgent care with left wrist pain which started yesterday.  Patient was opening a can when he had a pop in the left wrist.  Onset of pain was sudden.  Pain is currently 5 out of 10.  Aggravated by movement of the left wrist.  Relieved by not moving the left wrist.  No numbness or tingling in the fingers.  No radiation of pain.  Patient has mild swelling around the left wrist joint.   HPI  Past Medical History:  Diagnosis Date  . Anxiety   . GERD (gastroesophageal reflux disease)     Patient Active Problem List   Diagnosis Date Noted  . Heartburn   . Problems with swallowing and mastication   . Gastritis without bleeding   . Stricture and stenosis of esophagus   . Abnormal feces   . Benign neoplasm of cecum   . Polyp of sigmoid colon   . Chronic prostatitis 09/23/2012  . Hypertrophy of prostate with urinary obstruction and other lower urinary tract symptoms (LUTS) 09/23/2012  . 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 Other specified disorder of male genital organs(608.89) 09/23/2012    Past Surgical History:  Procedure Laterality Date  . APPENDECTOMY    . COLONOSCOPY WITH PROPOFOL N/A 08/31/2016   Procedure: COLONOSCOPY WITH PROPOFOL;  Surgeon: Lucilla Lame, MD;  Location: [place];  Service: Endoscopy;  Laterality: N/A;  . ESOPHAGEAL DILATION  08/31/2016   Procedure: ESOPHAGEAL DILATION;  Surgeon: Lucilla Lame, MD;  Location: [place];  Service: Endoscopy;;  . ESOPHAGOGASTRODUODENOSCOPY (EGD) WITH PROPOFOL N/A 08/31/2016   Procedure: ESOPHAGOGASTRODUODENOSCOPY (EGD) WITH PROPOFOL;  Surgeon: Lucilla Lame, MD;   Location: [place];  Service: Endoscopy;  Laterality: N/A;       Home Medications    Prior to Admission medications   Medication Sig Start Date End Date Taking? Authorizing Provider  Multiple Vitamin (MULTIVITAMIN) tablet Take 1 tablet by mouth daily.   Yes [provider]  Albuterol Sulfate (PROAIR RESPICLICK) 123XX123 (90 Base) MCG/ACT AEPB Inhale 1 puff into the lungs 2 (two) times daily. 06/03/18   Juline Patch, MD  ibuprofen (ADVIL) 600 MG tablet Take 1 tablet (600 mg total) by mouth every 6 (six) hours as needed. 03/29/19   LampteyMyrene Galas, MD    Family History Family History  Problem Relation Age of Onset  . Healthy Mother   . Healthy Father     Social History Social History   Tobacco Use  . Smoking status: Never Smoker  . Smokeless tobacco: Never Used  Substance Use Topics  . Alcohol use: Yes    Alcohol/week: 7.0 standard drinks    Types: 7 Cans of beer per week  . Drug use: No     Allergies   Patient has no known allergies.   Review of Systems Review of Systems  Constitutional: Negative.   Respiratory: Negative.  Negative for cough and shortness of breath.   Cardiovascular: Negative.   Gastrointestinal: Negative for diarrhea, nausea and vomiting.  Genitourinary: Negative.   Musculoskeletal: Positive for arthralgias and joint swelling. Negative for myalgias.  Skin: Negative.  Negative for color change and rash.  Neurological: Negative.   Psychiatric/Behavioral: Negative.      Physical Exam Triage Vital Signs ED Triage Vitals  Enc Vitals Group     BP 03/29/19 1129 (!) 140/92     Pulse Rate 03/29/19 1129 72     Resp 03/29/19 1129 16     Temp 03/29/19 1129 98.3 F (36.8 C)     Temp Source 03/29/19 1129 Oral     SpO2 03/29/19 1129 97 %     Weight 03/29/19 1127 225 lb (102.1 kg)     Height 03/29/19 1127 5\' 8"  (1.727 m)     Head Circumference --      Peak Flow --      Pain Score 03/29/19 1126 5     Pain Loc --      Pain Edu?  --      Excl. in [place]? --    No data found.  Updated Vital Signs BP (!) 140/92 (BP Location: Right Arm)   Pulse 72   Temp 98.3 F (36.8 C) (Oral)   Resp 16   Ht 5\' 8"  (1.727 m)   Wt 102.1 kg   SpO2 97%   BMI 34.21 kg/m   Visual Acuity Right Eye Distance:   Left Eye Distance:   Bilateral Distance:    Right Eye Near:   Left Eye Near:    Bilateral Near:     Physical Exam Vitals and nursing note reviewed.  Constitutional:      General: He is in acute distress.     Appearance: He is not ill-appearing.  Cardiovascular:     Pulses: Normal pulses.     Heart sounds: Normal heart sounds.  Pulmonary:     Effort: Pulmonary effort is normal.     Breath sounds: Normal breath sounds.  Abdominal:     General: Bowel sounds are normal.     Palpations: Abdomen is soft.     Tenderness: There is no abdominal tenderness. There is no guarding or rebound.  Musculoskeletal:     Comments: Left wrist swelling, mild.  Limited range of motion secondary to pain.  Patient has full range of motion around the metacarpophalangeal and interphalangeal joints.  Skin:    General: Skin is warm.     Capillary Refill: Capillary refill takes less than 2 seconds.     Findings: No bruising or erythema.  Neurological:     General: No focal deficit present.     Mental Status: He is alert and oriented to person, place, and time.      UC Treatments / Results  Labs (all labs ordered are listed, but only abnormal results are displayed) Labs Reviewed - No data to display  EKG   Radiology No results found.  Procedures Procedures (including critical care time)  Medications Ordered in UC Medications - No data to display  Initial Impression / Assessment and Plan / UC Course  I have reviewed the triage vital signs and the nursing notes.  Pertinent labs & imaging results that were available during my care of the patient were reviewed by me and considered in my medical decision making (see chart for  details).    1.  Left wrist sprain: X-ray of the left wrist is negative for any acute fractures Ibuprofen 600 mg every 6 hours as needed Rest, ice,gentle range of motion exercises. Final Clinical Impressions(s) / UC Diagnoses   Final diagnoses:  Sprain of left wrist, initial encounter  Discharge Instructions   None    ED Prescriptions    Medication Sig Dispense Auth. Provider   ibuprofen (ADVIL) 600 MG tablet Take 1 tablet (600 mg total) by mouth every 6 (six) hours as needed. 30 tablet Daltin Crist, Myrene Galas, MD     PDMP not reviewed this encounter.   Chase Picket, MD 03/29/19 [phone]

## 2019-03-29 NOTE — ED Triage Notes (Signed)
Patient c/o pain and a popping sound in his left wrist when he was opening a jar of jelly yesterday.

## 2019-05-27 DIAGNOSIS — Z85828 Personal history of other malignant neoplasm of skin: Secondary | ICD-10-CM | POA: Diagnosis not present

## 2019-05-27 DIAGNOSIS — L570 Actinic keratosis: Secondary | ICD-10-CM | POA: Diagnosis not present

## 2019-05-27 DIAGNOSIS — L578 Other skin changes due to chronic exposure to nonionizing radiation: Secondary | ICD-10-CM | POA: Diagnosis not present

## 2019-05-27 DIAGNOSIS — Z872 Personal history of diseases of the skin and subcutaneous tissue: Secondary | ICD-10-CM | POA: Diagnosis not present

## 2019-06-13 DIAGNOSIS — Z20828 Contact with and (suspected) exposure to other viral communicable diseases: Secondary | ICD-10-CM | POA: Diagnosis not present

## 2019-06-18 ENCOUNTER — Other Ambulatory Visit: Payer: Self-pay | Admitting: Unknown Physician Specialty

## 2019-06-18 ENCOUNTER — Telehealth: Payer: Self-pay | Admitting: Unknown Physician Specialty

## 2019-06-18 ENCOUNTER — Ambulatory Visit (HOSPITAL_COMMUNITY)
Admission: RE | Admit: 2019-06-18 | Discharge: 2019-06-18 | Disposition: A | Discharge status: Home / Self Care | Payer: BC Managed Care – PPO | Source: Ambulatory Visit | Attending: Pulmonary Disease | Admitting: Pulmonary Disease

## 2019-06-18 DIAGNOSIS — U071 COVID-19: Secondary | ICD-10-CM | POA: Insufficient documentation

## 2019-06-18 MED ORDER — EPINEPHRINE 0.3 MG/0.3ML IJ SOAJ: 0.3000 mg | Freq: Once | INTRAMUSCULAR | Status: DC | PRN

## 2019-06-18 MED ORDER — METHYLPREDNISOLONE SODIUM SUCC 125 MG IJ SOLR: 125.0000 mg | Freq: Once | INTRAMUSCULAR | Status: DC | PRN

## 2019-06-18 MED ORDER — SODIUM CHLORIDE 0.9 % IV SOLN: INTRAVENOUS | Status: DC | PRN

## 2019-06-18 MED ORDER — FAMOTIDINE IN NACL 20-0.9 MG/50ML-% IV SOLN: 20.0000 mg | Freq: Once | INTRAVENOUS | Status: DC | PRN

## 2019-06-18 MED ORDER — DIPHENHYDRAMINE HCL 50 MG/ML IJ SOLN: 50.0000 mg | Freq: Once | INTRAMUSCULAR | Status: DC | PRN

## 2019-06-18 MED ORDER — ALBUTEROL SULFATE HFA 108 (90 BASE) MCG/ACT IN AERS: 2.0000 | INHALATION_SPRAY | Freq: Once | RESPIRATORY_TRACT | Status: DC | PRN

## 2019-06-18 MED ORDER — SODIUM CHLORIDE 0.9 % IV SOLN
700.0000 mg | Freq: Once | INTRAVENOUS | Status: AC
  Administered 2019-06-18: 700 mg via INTRAVENOUS
  Filled 2019-06-18: qty 700

## 2019-06-18 NOTE — Telephone Encounter (Signed)
  I connected by phone with Johnny Liu on 06/18/2019 at 8:53 AM to discuss the potential use of an new treatment for mild to moderate COVID-19 viral infection in non-hospitalized patients.  This patient is a 54 y.o. male that meets the FDA criteria for Emergency Use Authorization of bamlanivimab or casirivimab\imdevimab.  Has a (+) direct SARS-CoV-2 viral test result  Has mild or moderate COVID-19   Is ? 55 years of age and weighs ? 40 kg  Is NOT hospitalized due to COVID-19  Is NOT requiring oxygen therapy or requiring an increase in baseline oxygen flow rate due to COVID-19  Is within 10 days of symptom onset  Has at least one of the high risk factor(s) for progression to severe COVID-19 and/or hospitalization as defined in EUA.  Specific high risk criteria : BMI >/= 35   I have spoken and communicated the following to the patient or parent/caregiver:  1. FDA has authorized the emergency use of bamlanivimab and casirivimab\imdevimab for the treatment of mild to moderate COVID-19 in adults and pediatric patients with positive results of direct SARS-CoV-2 viral testing who are 90 years of age and older weighing at least 40 kg, and who are at high risk for progressing to severe COVID-19 and/or hospitalization.  2. The significant known and potential risks and benefits of bamlanivimab and casirivimab\imdevimab, and the extent to which such potential risks and benefits are unknown.  3. Information on available alternative treatments and the risks and benefits of those alternatives, including clinical trials.  4. Patients treated with bamlanivimab and casirivimab\imdevimab should continue to self-isolate and use infection control measures (e.g., wear mask, isolate, social distance, avoid sharing personal items, clean and disinfect "high touch" surfaces, and frequent handwashing) according to CDC guidelines.   5. The patient or parent/caregiver has the option to accept or refuse  bamlanivimab or casirivimab\imdevimab .  After reviewing this information with the patient, The patient agreed to proceed with receiving the bamlanimivab infusion and will be provided a copy of the Fact sheet prior to receiving the infusion.Kathrine Haddock 06/18/2019 8:53 AM Sx onset 3/10

## 2019-06-18 NOTE — Discharge Instructions (Signed)

## 2019-06-18 NOTE — Progress Notes (Signed)
  Diagnosis: COVID-19  Physician:  Procedure: [facility] Med: bamlanivimab infusion - Provided patient with bamlanimivab fact sheet for patients, parents and caregivers prior to infusion.  Complications: No immediate complications noted.  Discharge: Discharged home   Janine Ores 06/18/2019

## 2019-07-07 ENCOUNTER — Telehealth: Payer: Self-pay

## 2019-07-07 DIAGNOSIS — E291 Testicular hypofunction: Secondary | ICD-10-CM | POA: Diagnosis not present

## 2019-07-07 NOTE — Telephone Encounter (Signed)
Called pt and left vm to call office and sched appt for cpe

## 2019-07-11 DIAGNOSIS — E291 Testicular hypofunction: Secondary | ICD-10-CM | POA: Diagnosis not present

## 2019-07-11 DIAGNOSIS — N529 Male erectile dysfunction, unspecified: Secondary | ICD-10-CM | POA: Diagnosis not present

## 2019-08-19 DIAGNOSIS — L218 Other seborrheic dermatitis: Secondary | ICD-10-CM | POA: Diagnosis not present

## 2019-09-08 ENCOUNTER — Encounter: Payer: Self-pay | Admitting: Family Medicine

## 2019-09-08 ENCOUNTER — Other Ambulatory Visit: Payer: Self-pay

## 2019-09-08 ENCOUNTER — Ambulatory Visit (INDEPENDENT_AMBULATORY_CARE_PROVIDER_SITE_OTHER): Payer: BC Managed Care – PPO | Admitting: Family Medicine

## 2019-09-08 VITALS — BP 138/80 | HR 80 | Ht 68.0 in | Wt 221.0 lb

## 2019-09-08 DIAGNOSIS — N342 Other urethritis: Secondary | ICD-10-CM | POA: Diagnosis not present

## 2019-09-08 MED ORDER — DOXYCYCLINE HYCLATE 100 MG PO TABS: 100.0000 mg | ORAL_TABLET | Freq: Two times a day (BID) | ORAL | 0 refills | Status: DC

## 2019-09-08 NOTE — Progress Notes (Signed)
Date:  09/08/2019   Name:  Johnny Liu   DOB:  1965-07-12   MRN:  [id number]   Chief Complaint: penile burning (discoloration of urine and lower back pain)  Male GU Problem The patient's pertinent negatives include no genital injury, genital itching, genital lesions, pelvic pain, penile discharge, penile pain, priapism, scrotal swelling or testicular pain. Primary symptoms comment: frequency/dysuria/ no discharge/. The current episode started more than 1 year ago. The problem occurs daily. The problem has been gradually improving. The pain is mild (dysuria). Pertinent negatives include no abdominal pain, anorexia, chest pain, chills, constipation, coughing, diarrhea, discolored urine, dysuria, fever, flank pain, frequency, headaches, hematuria, hesitancy, joint pain, joint swelling, nausea, painful intercourse, rash, shortness of breath, sore throat, urgency, urinary retention or vomiting. The treatment provided moderate relief. He is sexually active.    Lab Results  Component Value Date   CREATININE 0.97 12/21/2015   BUN 14 12/21/2015   NA 140 12/21/2015   K 4.3 12/21/2015   CL 96 12/21/2015   CO2 25 12/21/2015   Lab Results  Component Value Date   CHOL 144 12/31/2017   HDL 52 12/21/2015   LDLCALC 101 (H) 12/21/2015   TRIG 74 12/21/2015   CHOLHDL 3.2 12/21/2015   No results found for: TSH No results found for: HGBA1C No results found for: WBC, HGB, HCT, MCV, PLT Lab Results  Component Value Date   ALT 35 08/14/2016   AST 36 08/14/2016   ALKPHOS 63 08/14/2016   BILITOT 0.3 08/14/2016     Review of Systems  Constitutional: Negative for chills and fever.  HENT: Negative for drooling, ear discharge, ear pain and sore throat.   Respiratory: Negative for cough, shortness of breath and wheezing.   Cardiovascular: Negative for chest pain, palpitations and leg swelling.  Gastrointestinal: Negative for abdominal pain, anorexia, blood in stool, constipation, diarrhea, nausea  and vomiting.  Endocrine: Negative for polydipsia.  Genitourinary: Negative for discharge, dysuria, flank pain, frequency, hematuria, hesitancy, pelvic pain, penile pain, scrotal swelling, testicular pain and urgency.  Musculoskeletal: Negative for back pain, joint pain, myalgias and neck pain.  Skin: Negative for rash.  Allergic/Immunologic: Negative for environmental allergies.  Neurological: Negative for dizziness and headaches.  Hematological: Does not bruise/bleed easily.  Psychiatric/Behavioral: Negative for suicidal ideas. The patient is not nervous/anxious.     Patient Active Problem List   Diagnosis Date Noted  . Heartburn   . Problems with swallowing and mastication   . Gastritis without bleeding   . Stricture and stenosis of esophagus   . Abnormal feces   . Benign neoplasm of cecum   . Polyp of sigmoid colon   . Chronic prostatitis 09/23/2012  . Hypertrophy of prostate with urinary obstruction and other lower urinary tract symptoms (LUTS) 09/23/2012  . 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 Other specified disorder of male genital organs(608.89) 09/23/2012    No Known Allergies  Past Surgical History:  Procedure Laterality Date  . APPENDECTOMY    . COLONOSCOPY WITH PROPOFOL N/A 08/31/2016   Procedure: COLONOSCOPY WITH PROPOFOL;  Surgeon: Lucilla Lame, MD;  Location: [facility];  Service: Endoscopy;  Laterality: N/A;  . ESOPHAGEAL DILATION  08/31/2016   Procedure: ESOPHAGEAL DILATION;  Surgeon: Lucilla Lame, MD;  Location: [facility];  Service: Endoscopy;;  . ESOPHAGOGASTRODUODENOSCOPY (EGD) WITH PROPOFOL N/A 08/31/2016   Procedure: ESOPHAGOGASTRODUODENOSCOPY (EGD) WITH PROPOFOL;  Surgeon: Lucilla Lame, MD;  Location: [place];  Service: Endoscopy;  Laterality: N/A;    Social History   Tobacco Use  . Smoking status: Never Smoker  . Smokeless tobacco: Never Used   Substance Use Topics  . Alcohol use: Yes    Alcohol/week: 7.0 standard drinks    Types: 7 Cans of beer per week  . Drug use: No     Medication list has been reviewed and updated.  Current Meds  Medication Sig  . Multiple Vitamin (MULTIVITAMIN) tablet Take 1 tablet by mouth daily.  Marland Kitchen testosterone cypionate (DEPOTESTOSTERONE CYPIONATE) 200 MG/ML injection Inject into the muscle. urology  . [DISCONTINUED] Albuterol Sulfate (PROAIR RESPICLICK) 295 (90 Base) MCG/ACT AEPB Inhale 1 puff into the lungs 2 (two) times daily.    PHQ 2/9 Scores 09/08/2019 12/31/2017 05/04/2017  PHQ - 2 Score 0 0 0  PHQ- 9 Score 0 0 0    BP Readings from Last 3 Encounters:  09/08/19 138/80  06/18/19 (!) 132/93  03/29/19 (!) 140/92    Physical Exam Vitals and nursing note reviewed.  HENT:     Head: Normocephalic.     Right Ear: Tympanic membrane and external ear normal.     Left Ear: Tympanic membrane and external ear normal.     Nose: Nose normal.  Eyes:     General: No scleral icterus.       Right eye: No discharge.        Left eye: No discharge.     Conjunctiva/sclera: Conjunctivae normal.     Pupils: Pupils are equal, round, and reactive to light.  Neck:     Thyroid: No thyromegaly.     Vascular: No JVD.     Trachea: No tracheal deviation.  Cardiovascular:     Rate and Rhythm: Normal rate and regular rhythm.     Heart sounds: Normal heart sounds. No murmur. No friction rub. No gallop.   Pulmonary:     Effort: No respiratory distress.     Breath sounds: Normal breath sounds. No wheezing or rales.  Abdominal:     General: Bowel sounds are normal.     Palpations: Abdomen is soft. There is no mass.     Tenderness: There is no abdominal tenderness. There is no guarding or rebound.  Genitourinary:    Penis: Erythema present. No discharge.      Testes: Normal.        Right: Mass not present.        Left: Mass not present.     Epididymis:     Right: Normal.     Left: Normal.   Musculoskeletal:        General: No tenderness. Normal range of motion.     Cervical back: Normal range of motion and neck supple.  Lymphadenopathy:     Cervical: No cervical adenopathy.  Skin:    General: Skin is warm.     Findings: No rash.  Neurological:     Mental Status: He is alert and oriented to person, place, and time.     Cranial Nerves: No cranial nerve deficit.     Deep Tendon Reflexes: Reflexes are normal and symmetric.     Wt Readings from Last 3 Encounters:  09/08/19 221 lb (100.2 kg)  03/29/19 225 lb (102.1 kg)  06/03/18 226 lb (102.5 kg)    BP 138/80   Pulse 80   Ht 5\' 8"  (1.727 m)   Wt 221 lb (100.2 kg)   BMI 33.60 kg/m   Assessment  and Plan: 1. Urethritis New onset.  Persistent.  History (frequency, urgency, dysuria) and physical exam is consistent with urethritis.  Will treat with doxycycline 100 mg twice a day for 10 days. - doxycycline (VIBRA-TABS) 100 MG tablet; Take 1 tablet (100 mg total) by mouth 2 (two) times daily.  Dispense: 20 tablet; Refill: 0

## 2019-09-12 ENCOUNTER — Telehealth: Payer: Self-pay | Admitting: Family Medicine

## 2019-09-12 NOTE — Telephone Encounter (Unsigned)
Copied from [place] [id number]. Topic: General - Inquiry >> Sep 12, 2019  3:50 PM Richardo Priest, [place] wrote: Reason for CRM: Patient called in stating he would like to speak with nurse in regards to recent visit. Patient stated that wife was treated somewhere else and they recommended that patient start Metronidazole 500mg . Please advise as patient would like this called in today.

## 2019-09-15 NOTE — Telephone Encounter (Signed)
Patient called back and was read the message from [place].  Patient stated that his wife was not given a direct diagnosis.  She was treated with antibiotics for a general infection.  One of the antibiotics was cipro.  If she needs to speak with him further, please call at [phone]

## 2019-09-15 NOTE — Telephone Encounter (Signed)
Patient checking on the status of message below requesting a call back today at [phone]. Informed patient nurse / PCP is in clinic and will follow up

## 2019-09-15 NOTE — Telephone Encounter (Signed)
Called pt back and left a message on vm- we need to know Diagnosis of wife when she went to the doctor. This is if pt calls back.

## 2019-09-15 NOTE — Telephone Encounter (Signed)
Spoke with pt and informed if he is not doing better he needs to be seen for std testing. He verbalized understanding and made an appt for tomorrow at 10am.  He verbalized understanding to come with a full bladder so the nurse can get a urine specimen from him.   CM

## 2019-09-16 ENCOUNTER — Other Ambulatory Visit: Payer: Self-pay

## 2019-09-16 ENCOUNTER — Other Ambulatory Visit: Payer: Self-pay | Admitting: Family Medicine

## 2019-09-16 ENCOUNTER — Encounter: Payer: Self-pay | Admitting: Family Medicine

## 2019-09-16 ENCOUNTER — Ambulatory Visit (INDEPENDENT_AMBULATORY_CARE_PROVIDER_SITE_OTHER): Payer: BC Managed Care – PPO | Admitting: Family Medicine

## 2019-09-16 VITALS — BP 138/100 | HR 104 | Ht 68.0 in | Wt 219.0 lb

## 2019-09-16 DIAGNOSIS — N342 Other urethritis: Secondary | ICD-10-CM

## 2019-09-16 DIAGNOSIS — Z1159 Encounter for screening for other viral diseases: Secondary | ICD-10-CM | POA: Diagnosis not present

## 2019-09-16 DIAGNOSIS — Z114 Encounter for screening for human immunodeficiency virus [HIV]: Secondary | ICD-10-CM | POA: Diagnosis not present

## 2019-09-16 MED ORDER — METRONIDAZOLE 500 MG PO TABS: 500.0000 mg | ORAL_TABLET | Freq: Three times a day (TID) | ORAL | 0 refills | Status: DC

## 2019-09-16 MED ORDER — CIPROFLOXACIN HCL 500 MG PO TABS: 500.0000 mg | ORAL_TABLET | Freq: Two times a day (BID) | ORAL | 0 refills | Status: DC

## 2019-09-16 NOTE — Progress Notes (Signed)
Date:  09/16/2019   Name:  Johnny Liu   DOB:  1965-05-15   MRN:  [id number]   Chief Complaint: Follow-up (has had 7.5 days of doxycycline- stil having irritation and redness/ "inflamed, testicle itching")  Urinary Tract Infection  This is a new problem. The current episode started yesterday. The problem occurs every urination. The problem has been unchanged. The quality of the pain is described as burning. The pain is mild. There has been no fever. He is sexually active. Associated symptoms include a discharge and frequency. Pertinent negatives include no chills, flank pain, hematuria, hesitancy, nausea, sweats, urgency or vomiting. There is no history of recurrent UTIs.    Lab Results  Component Value Date   CREATININE 0.97 12/21/2015   BUN 14 12/21/2015   NA 140 12/21/2015   K 4.3 12/21/2015   CL 96 12/21/2015   CO2 25 12/21/2015   Lab Results  Component Value Date   CHOL 144 12/31/2017   HDL 52 12/21/2015   LDLCALC 101 (H) 12/21/2015   TRIG 74 12/21/2015   CHOLHDL 3.2 12/21/2015   No results found for: TSH No results found for: HGBA1C No results found for: WBC, HGB, HCT, MCV, PLT Lab Results  Component Value Date   ALT 35 08/14/2016   AST 36 08/14/2016   ALKPHOS 63 08/14/2016   BILITOT 0.3 08/14/2016     Review of Systems  Constitutional: Negative for chills and fever.  HENT: Negative for drooling, ear discharge, ear pain and sore throat.   Respiratory: Negative for cough, shortness of breath and wheezing.   Cardiovascular: Negative for chest pain, palpitations and leg swelling.  Gastrointestinal: Negative for abdominal pain, blood in stool, constipation, diarrhea, nausea and vomiting.  Endocrine: Negative for polydipsia.  Genitourinary: Positive for dysuria and frequency. Negative for flank pain, hematuria, hesitancy and urgency.  Musculoskeletal: Negative for back pain, myalgias and neck pain.  Skin: Negative for rash.  Allergic/Immunologic: Negative for  environmental allergies.  Neurological: Negative for dizziness and headaches.  Hematological: Does not bruise/bleed easily.  Psychiatric/Behavioral: Negative for suicidal ideas. The patient is not nervous/anxious.     Patient Active Problem List   Diagnosis Date Noted  . Heartburn   . Problems with swallowing and mastication   . Gastritis without bleeding   . Stricture and stenosis of esophagus   . Abnormal feces   . Benign neoplasm of cecum   . Polyp of sigmoid colon   . Chronic prostatitis 09/23/2012  . Hypertrophy of prostate with urinary obstruction and other lower urinary tract symptoms (LUTS) 09/23/2012  . 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 Other specified disorder of male genital organs(608.89) 09/23/2012    No Known Allergies  Past Surgical History:  Procedure Laterality Date  . APPENDECTOMY    . COLONOSCOPY WITH PROPOFOL N/A 08/31/2016   Procedure: COLONOSCOPY WITH PROPOFOL;  Surgeon: Lucilla Lame, MD;  Location: [facility];  Service: Endoscopy;  Laterality: N/A;  . ESOPHAGEAL DILATION  08/31/2016   Procedure: ESOPHAGEAL DILATION;  Surgeon: Lucilla Lame, MD;  Location: [facility] [facility];  Service: Endoscopy;;  . ESOPHAGOGASTRODUODENOSCOPY (EGD) WITH PROPOFOL N/A 08/31/2016   Procedure: ESOPHAGOGASTRODUODENOSCOPY (EGD) WITH PROPOFOL;  Surgeon: Lucilla Lame, MD;  Location: [facility];  Service: Endoscopy;  Laterality: N/A;    Social History   Tobacco Use  . Smoking status: Never Smoker  . Smokeless tobacco: Never Used  Vaping Use  .  Vaping Use: Never used  Substance Use Topics  . Alcohol use: Yes    Alcohol/week: 7.0 standard drinks    Types: 7 Cans of beer per week  . Drug use: No     Medication list has been reviewed and updated.  No outpatient medications have been marked as taking for the 09/16/19 encounter (Office Visit) with Juline Patch, MD.    [facility] 2/9  Scores 09/16/2019 09/08/2019 12/31/2017 05/04/2017  PHQ - 2 Score 0 0 0 0  PHQ- 9 Score 0 0 0 0    GAD 7 : Generalized Anxiety Score 09/16/2019 09/08/2019  Nervous, Anxious, on Edge 0 0  Control/stop worrying 0 0  Worry too much - different things 0 0  Trouble relaxing 0 0  Restless 0 0  Easily annoyed or irritable 0 0  Afraid - awful might happen 0 0  Total GAD 7 Score 0 0    BP Readings from Last 3 Encounters:  09/16/19 (!) 138/100  09/08/19 138/80  06/18/19 (!) 132/93    Physical Exam Vitals and nursing note reviewed.  HENT:     Head: Normocephalic.     Right Ear: External ear normal.     Left Ear: External ear normal.     Nose: Nose normal.  Eyes:     General: No scleral icterus.       Right eye: No discharge.        Left eye: No discharge.     Conjunctiva/sclera: Conjunctivae normal.     Pupils: Pupils are equal, round, and reactive to light.  Neck:     Thyroid: No thyromegaly.     Vascular: No JVD.     Trachea: No tracheal deviation.  Cardiovascular:     Rate and Rhythm: Normal rate and regular rhythm.     Heart sounds: Normal heart sounds. No murmur heard.  No friction rub. No gallop.   Pulmonary:     Effort: No respiratory distress.     Breath sounds: Normal breath sounds. No wheezing or rales.  Abdominal:     General: Bowel sounds are normal.     Palpations: Abdomen is soft. There is no mass.     Tenderness: There is no abdominal tenderness. There is no guarding or rebound.  Genitourinary:    Penis: Erythema present.      Testes: Normal.     Epididymis:     Right: Normal.     Left: Normal.     Comments: Urethral erythema Musculoskeletal:        General: No tenderness. Normal range of motion.     Cervical back: Normal range of motion and neck supple.  Lymphadenopathy:     Cervical: No cervical adenopathy.  Skin:    General: Skin is warm.     Findings: No rash.  Neurological:     Mental Status: He is alert and oriented to person, place, and time.      Cranial Nerves: No cranial nerve deficit.     Deep Tendon Reflexes: Reflexes are normal and symmetric.     Wt Readings from Last 3 Encounters:  09/16/19 219 lb (99.3 kg)  09/08/19 221 lb (100.2 kg)  03/29/19 225 lb (102.1 kg)    BP (!) 138/100   Pulse (!) 104   Ht 5\' 8"  (1.727 m)   Wt 219 lb (99.3 kg)   BMI 33.30 kg/m   Assessment and Plan: 1. Urethritis Acute.  Some persistence of symptoms.  Stable.  Patient still  has some mild symptoms status post doxycycline.  We will do a GC chlamydia probe test and if positive patient has been given Cipro 500 mg twice a day to initiate and will continue for 10 days if it is negative we will have patient stop the Cipro at that time.  There is also concerned about the possibility of a localized infection involving his spells and will patient desires to know his HSV antibody IgG circumstance. - GC/Chlamydia Probe Amp([facility]) - HSV(herpes simplex vrs) 1+2 ab-IgG  2. Encounter for screening for HIV Per protocol with current recommendations patient will be screened for HIV - HSV(herpes simplex vrs) 1+2 ab-IgG - HIV antibody (with reflex)  3. Need for hepatitis C screening test Per protocol for current recommendations patient will be screened for hepatitis C. - Hepatitis C antibody

## 2019-09-17 LAB — HIV ANTIBODY (ROUTINE TESTING W REFLEX): HIV Screen 4th Generation wRfx: NONREACTIVE

## 2019-09-17 LAB — HSV(HERPES SIMPLEX VRS) I + II AB-IGG
HSV 1 Glycoprotein G Ab, IgG: <0.91 index (ref 0.00–0.90)
HSV 2 IgG, Type Spec: <0.91 index (ref 0.00–0.90)

## 2019-09-17 LAB — HEPATITIS C ANTIBODY: Hep C Virus Ab: <0.1 s/co ratio (ref 0.0–0.9)

## 2019-09-18 LAB — GC/CHLAMYDIA PROBE AMP
Chlamydia trachomatis, NAA: NEGATIVE
Neisseria Gonorrhoeae by PCR: NEGATIVE

## 2019-09-18 NOTE — Addendum Note (Signed)
Addended by: Fredderick Severance on: 09/18/2019 04:54 PM   Modules accepted: Orders

## 2019-10-01 ENCOUNTER — Other Ambulatory Visit: Payer: Self-pay

## 2019-10-01 ENCOUNTER — Encounter: Payer: Self-pay | Admitting: Family Medicine

## 2019-10-01 ENCOUNTER — Ambulatory Visit (INDEPENDENT_AMBULATORY_CARE_PROVIDER_SITE_OTHER): Payer: BC Managed Care – PPO | Admitting: Family Medicine

## 2019-10-01 VITALS — BP 130/88 | HR 78 | Ht 68.0 in | Wt 218.0 lb

## 2019-10-01 DIAGNOSIS — Z1211 Encounter for screening for malignant neoplasm of colon: Secondary | ICD-10-CM

## 2019-10-01 DIAGNOSIS — Z0000 Encounter for general adult medical examination without abnormal findings: Secondary | ICD-10-CM

## 2019-10-01 DIAGNOSIS — N342 Other urethritis: Secondary | ICD-10-CM | POA: Diagnosis not present

## 2019-10-01 LAB — POCT URINALYSIS DIPSTICK
Bilirubin, UA: NEGATIVE
Blood, UA: NEGATIVE
Glucose, UA: NEGATIVE
Ketones, UA: NEGATIVE
Leukocytes, UA: NEGATIVE
Nitrite, UA: NEGATIVE
Protein, UA: NEGATIVE
Spec Grav, UA: <=1.005 — AB (ref 1.010–1.025)
Urobilinogen, UA: 0.2 E.U./dL
pH, UA: 5 (ref 5.0–8.0)

## 2019-10-01 LAB — HEMOCCULT GUIAC POC 1CARD (OFFICE): Fecal Occult Blood, POC: NEGATIVE

## 2019-10-01 MED ORDER — AZITHROMYCIN 250 MG PO TABS: ORAL_TABLET | ORAL | 0 refills | Status: DC

## 2019-10-01 NOTE — Progress Notes (Signed)
Date:  10/01/2019   Name:  Johnny Liu   DOB:  10-Oct-1965   MRN:  [id number]   Chief Complaint: Annual Exam  Patient is a 54 year old male who presents for a comprehensive physical exam. The patient reports the following problems: followup. Health maintenance has been reviewed up to date.   Lab Results  Component Value Date   CREATININE 0.97 12/21/2015   BUN 14 12/21/2015   NA 140 12/21/2015   K 4.3 12/21/2015   CL 96 12/21/2015   CO2 25 12/21/2015   Lab Results  Component Value Date   CHOL 144 12/31/2017   HDL 52 12/21/2015   LDLCALC 101 (H) 12/21/2015   TRIG 74 12/21/2015   CHOLHDL 3.2 12/21/2015   No results found for: TSH No results found for: HGBA1C No results found for: WBC, HGB, HCT, MCV, PLT Lab Results  Component Value Date   ALT 35 08/14/2016   AST 36 08/14/2016   ALKPHOS 63 08/14/2016   BILITOT 0.3 08/14/2016     Review of Systems  Constitutional: Negative for chills and fever.  HENT: Negative for drooling, ear discharge, ear pain and sore throat.   Respiratory: Negative for cough, shortness of breath and wheezing.   Cardiovascular: Negative for chest pain, palpitations and leg swelling.  Gastrointestinal: Negative for abdominal pain, blood in stool, constipation, diarrhea and nausea.  Endocrine: Negative for polydipsia.  Genitourinary: Negative for dysuria, frequency, hematuria and urgency.  Musculoskeletal: Negative for back pain, myalgias and neck pain.  Skin: Negative for rash.  Allergic/Immunologic: Negative for environmental allergies.  Neurological: Negative for dizziness and headaches.  Hematological: Does not bruise/bleed easily.  Psychiatric/Behavioral: Negative for suicidal ideas. The patient is not nervous/anxious.     Patient Active Problem List   Diagnosis Date Noted  . Heartburn   . Problems with swallowing and mastication   . Gastritis without bleeding   . Stricture and stenosis of esophagus   . Abnormal feces   . Benign  neoplasm of cecum   . Polyp of sigmoid colon   . Chronic prostatitis 09/23/2012  . Hypertrophy of prostate with urinary obstruction and other lower urinary tract symptoms (LUTS) 09/23/2012  . 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 Other specified disorder of male genital organs(608.89) 09/23/2012    No Known Allergies  Past Surgical History:  Procedure Laterality Date  . APPENDECTOMY    . COLONOSCOPY WITH PROPOFOL N/A 08/31/2016   Procedure: COLONOSCOPY WITH PROPOFOL;  Surgeon: Lucilla Lame, MD;  Location: [place];  Service: Endoscopy;  Laterality: N/A;  . ESOPHAGEAL DILATION  08/31/2016   Procedure: ESOPHAGEAL DILATION;  Surgeon: Lucilla Lame, MD;  Location: [place];  Service: Endoscopy;;  . ESOPHAGOGASTRODUODENOSCOPY (EGD) WITH PROPOFOL N/A 08/31/2016   Procedure: ESOPHAGOGASTRODUODENOSCOPY (EGD) WITH PROPOFOL;  Surgeon: Lucilla Lame, MD;  Location: [place];  Service: Endoscopy;  Laterality: N/A;    Social History   Tobacco Use  . Smoking status: Never Smoker  . Smokeless tobacco: Never Used  Vaping Use  . Vaping Use: Never used  Substance Use Topics  . Alcohol use: Yes    Alcohol/week: 7.0 standard drinks    Types: 7 Cans of beer per week  . Drug use: No     Medication list has been reviewed and updated.  Current Meds  Medication Sig  . ciprofloxacin (CIPRO) 500 MG tablet Take 1 tablet (500 mg total) by  mouth 2 (two) times daily.  . Multiple Vitamin (MULTIVITAMIN) tablet Take 1 tablet by mouth daily.  Marland Kitchen testosterone cypionate (DEPOTESTOSTERONE CYPIONATE) 200 MG/ML injection Inject into the muscle. urology    [facility] 2/9 Scores 10/01/2019 09/16/2019 09/08/2019 12/31/2017  PHQ - 2 Score 0 0 0 0  PHQ- 9 Score 0 0 0 0    GAD 7 : Generalized Anxiety Score 10/01/2019 09/16/2019 09/08/2019  Nervous, Anxious, on Edge 0 0 0  Control/stop worrying 0 0 0  Worry too much -  different things 0 0 0  Trouble relaxing 0 0 0  Restless 0 0 0  Easily annoyed or irritable 0 0 0  Afraid - awful might happen 0 0 0  Total GAD 7 Score 0 0 0    BP Readings from Last 3 Encounters:  10/01/19 130/88  09/16/19 (!) 138/100  09/08/19 138/80    Physical Exam Vitals and nursing note reviewed.  Constitutional:      Appearance: Normal appearance. He is well-developed and well-groomed.  HENT:     Head: Normocephalic.     Jaw: There is normal jaw occlusion.     Right Ear: Hearing, tympanic membrane, ear canal and external ear normal.     Left Ear: Hearing, tympanic membrane, ear canal and external ear normal.     Nose: Nose normal. No congestion or rhinorrhea.     Mouth/Throat:     Lips: Pink.     Mouth: Mucous membranes are moist.     Pharynx: Oropharynx is clear. Uvula midline.  Eyes:     General: Lids are normal. Vision grossly intact. Gaze aligned appropriately. No scleral icterus.       Right eye: No discharge.        Left eye: No discharge.     Conjunctiva/sclera: Conjunctivae normal.     Pupils: Pupils are equal, round, and reactive to light.     Funduscopic exam:    Right eye: Red reflex present.        Left eye: Red reflex present. Neck:     Thyroid: No thyroid mass, thyromegaly or thyroid tenderness.     Vascular: Normal carotid pulses. No carotid bruit, hepatojugular reflux or JVD.     Trachea: Trachea and phonation normal. No tracheal deviation.  Cardiovascular:     Rate and Rhythm: Normal rate and regular rhythm.     Chest Wall: PMI is not displaced. No thrill.     Pulses: Normal pulses.          Carotid pulses are 2+ on the right side and 2+ on the left side.      Radial pulses are 2+ on the right side and 2+ on the left side.       Femoral pulses are 2+ on the right side and 2+ on the left side.      Popliteal pulses are 2+ on the right side and 2+ on the left side.       Dorsalis pedis pulses are 2+ on the right side and 2+ on the left side.        Posterior tibial pulses are 2+ on the right side and 2+ on the left side.     Heart sounds: Normal heart sounds, S1 normal and S2 normal. No murmur heard.  No systolic murmur is present.  No diastolic murmur is present.  No friction rub. No gallop. No S3 or S4 sounds.   Pulmonary:     Effort: Pulmonary effort is normal. No  respiratory distress.     Breath sounds: Normal breath sounds. No decreased breath sounds, wheezing, rhonchi or rales.  Abdominal:     General: Bowel sounds are normal.     Palpations: Abdomen is soft. There is no mass.     Tenderness: There is no abdominal tenderness. There is no guarding or rebound.     Hernia: There is no hernia in the left inguinal area or right inguinal area.  Genitourinary:    Penis: Normal.      Testes: Normal.        Right: Mass, tenderness, swelling, testicular hydrocele or varicocele not present.        Left: Mass, tenderness, swelling, testicular hydrocele or varicocele not present.     Epididymis:     Right: Normal. Not inflamed or enlarged. No mass or tenderness.     Left: Normal. Not inflamed or enlarged. No mass or tenderness.     Prostate: Normal. Not enlarged and no nodules present.     Rectum: Normal. Guaiac result negative. No mass.  Musculoskeletal:        General: No tenderness. Normal range of motion.     Cervical back: Normal, full passive range of motion without pain, normal range of motion and neck supple.     Thoracic back: Normal.     Lumbar back: Normal.     Right lower leg: No edema.     Left lower leg: No edema.  Lymphadenopathy:     Head:     Right side of head: No submental, submandibular or tonsillar adenopathy.     Left side of head: No submental, submandibular or tonsillar adenopathy.     Cervical: No cervical adenopathy.     Right cervical: No superficial, deep or posterior cervical adenopathy.    Left cervical: No superficial, deep or posterior cervical adenopathy.     Upper Body:     Right upper body: No  supraclavicular or axillary adenopathy.     Left upper body: No supraclavicular or axillary adenopathy.     Lower Body: No right inguinal adenopathy.  Skin:    General: Skin is warm.     Capillary Refill: Capillary refill takes less than 2 seconds.     Findings: No rash.  Neurological:     Mental Status: He is alert and oriented to person, place, and time.     Cranial Nerves: Cranial nerves are intact. No cranial nerve deficit.     Motor: Motor function is intact.     Deep Tendon Reflexes: Reflexes are normal and symmetric.     Reflex Scores:      Tricep reflexes are 2+ on the right side and 2+ on the left side.      Bicep reflexes are 2+ on the right side and 2+ on the left side.      Brachioradialis reflexes are 2+ on the right side and 2+ on the left side.      Patellar reflexes are 2+ on the right side and 2+ on the left side.      Achilles reflexes are 2+ on the right side and 2+ on the left side. Psychiatric:        Behavior: Behavior is cooperative.     Wt Readings from Last 3 Encounters:  10/01/19 218 lb (98.9 kg)  09/16/19 219 lb (99.3 kg)  09/08/19 221 lb (100.2 kg)    BP 130/88   Pulse 78   Ht 5\' 8"  (1.727 m)   Wt  218 lb (98.9 kg)   BMI 33.15 kg/m   Assessment and Plan: 1. Annual physical exam No subjective/objective concerns noted on history and physical exam.  Patient's chart was reviewed with reference to encounters, most recent labs, most recent imaging, and care everywhere.BRADDOCK SERVELLON is a 54 y.o. male who presents today for his Complete Annual Exam. He feels well. He reports exercising . He reports he is sleeping well. Immunizations are reviewed and recommendations provided.   Age appropriate screening tests are discussed. Counseling given for risk factor reduction interventions.  We will obtain a CMP, lipid panel, CBC and PSA. - Comprehensive metabolic panel - Lipid Panel With LDL/HDL Ratio - CBC with Differential/Platelet - PSA  2. Urethritis Patient  is recovering from urethritis and we will follow up with a azithromycin to cover for mycoplasma. - azithromycin (ZITHROMAX) 250 MG tablet; 2 today followed by 1 a day for 4 days  Dispense: 6 tablet; Refill: 0 - POCT urinalysis dipstick  3. Colon cancer screening Discussed and patient is up-to-date with colonoscopy occult blood was noted to be negative. - POCT Occult Blood Stool

## 2019-10-02 DIAGNOSIS — Z0000 Encounter for general adult medical examination without abnormal findings: Secondary | ICD-10-CM | POA: Diagnosis not present

## 2019-10-03 LAB — COMPREHENSIVE METABOLIC PANEL
ALT: 39 IU/L (ref 0–44)
AST: 33 IU/L (ref 0–40)
Albumin/Globulin Ratio: 1.8 (ref 1.2–2.2)
Albumin: 4.8 g/dL (ref 3.8–4.9)
Alkaline Phosphatase: 76 IU/L (ref 48–121)
BUN/Creatinine Ratio: 17 (ref 9–20)
BUN: 19 mg/dL (ref 6–24)
Bilirubin Total: 0.2 mg/dL (ref 0.0–1.2)
CO2: 21 mmol/L (ref 20–29)
Calcium: 9.3 mg/dL (ref 8.7–10.2)
Chloride: 103 mmol/L (ref 96–106)
Creatinine, Ser: 1.12 mg/dL (ref 0.76–1.27)
GFR calc Af Amer: 86 mL/min/{1.73_m2} (ref >59)
GFR calc non Af Amer: 74 mL/min/{1.73_m2} (ref >59)
Globulin, Total: 2.7 g/dL (ref 1.5–4.5)
Glucose: 107 mg/dL — ABNORMAL HIGH (ref 65–99)
Potassium: 4.2 mmol/L (ref 3.5–5.2)
Sodium: 137 mmol/L (ref 134–144)
Total Protein: 7.5 g/dL (ref 6.0–8.5)

## 2019-10-03 LAB — PSA: Prostate Specific Ag, Serum: 1.1 ng/mL (ref 0.0–4.0)

## 2019-10-03 LAB — CBC WITH DIFFERENTIAL/PLATELET
Basophils Absolute: 0.1 10*3/uL (ref 0.0–0.2)
Basos: 1 %
EOS (ABSOLUTE): 0.2 10*3/uL (ref 0.0–0.4)
Eos: 2 %
Hematocrit: 43.3 % (ref 37.5–51.0)
Hemoglobin: 15.2 g/dL (ref 13.0–17.7)
Immature Grans (Abs): 0 10*3/uL (ref 0.0–0.1)
Immature Granulocytes: 0 %
Lymphocytes Absolute: 1.6 10*3/uL (ref 0.7–3.1)
Lymphs: 21 %
MCH: 31.7 pg (ref 26.6–33.0)
MCHC: 35.1 g/dL (ref 31.5–35.7)
MCV: 90 fL (ref 79–97)
Monocytes Absolute: 0.7 10*3/uL (ref 0.1–0.9)
Monocytes: 9 %
Neutrophils Absolute: 5.2 10*3/uL (ref 1.4–7.0)
Neutrophils: 67 %
Platelets: 204 10*3/uL (ref 150–450)
RBC: 4.79 x10E6/uL (ref 4.14–5.80)
RDW: 13 % (ref 11.6–15.4)
WBC: 7.8 10*3/uL (ref 3.4–10.8)

## 2019-10-03 LAB — LIPID PANEL WITH LDL/HDL RATIO
Cholesterol, Total: 156 mg/dL (ref 100–199)
HDL: 52 mg/dL (ref >39)
LDL Chol Calc (NIH): 92 mg/dL (ref 0–99)
LDL/HDL Ratio: 1.8 ratio (ref 0.0–3.6)
Triglycerides: 62 mg/dL (ref 0–149)
VLDL Cholesterol Cal: 12 mg/dL (ref 5–40)

## 2020-02-02 ENCOUNTER — Other Ambulatory Visit: Payer: Self-pay

## 2020-02-02 ENCOUNTER — Ambulatory Visit (INDEPENDENT_AMBULATORY_CARE_PROVIDER_SITE_OTHER): Payer: BC Managed Care – PPO | Admitting: Family Medicine

## 2020-02-02 ENCOUNTER — Encounter: Payer: Self-pay | Admitting: Family Medicine

## 2020-02-02 VITALS — BP 130/80 | HR 80 | Ht 68.0 in | Wt 222.0 lb

## 2020-02-02 DIAGNOSIS — R5383 Other fatigue: Secondary | ICD-10-CM | POA: Diagnosis not present

## 2020-02-02 NOTE — Progress Notes (Signed)
Date:  02/02/2020   Name:  Johnny Liu   DOB:  May 06, 1965   MRN:  [id number]   Chief Complaint: Hypertension (x 1 month- having nose bleeds and vision disturbances, fatigue)  Hypertension This is a new problem. The current episode started more than 1 year ago. The problem has been waxing and waning since onset. Associated symptoms include blurred vision, headaches, malaise/fatigue, palpitations and shortness of breath. Pertinent negatives include no chest pain, neck pain or sweats. Past treatments include nothing. Identifiable causes of hypertension include a thyroid problem.  Thyroid Problem Presents for initial visit. Symptoms include depressed mood, diaphoresis, diarrhea, dry skin, fatigue, palpitations, visual change and weight gain. Patient reports no anxiety, cold intolerance, constipation, hair loss, heat intolerance or nail problem. The symptoms have been stable. The treatment provided mild relief.    Lab Results  Component Value Date   CREATININE 1.12 10/02/2019   BUN 19 10/02/2019   NA 137 10/02/2019   K 4.2 10/02/2019   CL 103 10/02/2019   CO2 21 10/02/2019   Lab Results  Component Value Date   CHOL 156 10/02/2019   HDL 52 10/02/2019   LDLCALC 92 10/02/2019   TRIG 62 10/02/2019   CHOLHDL 3.2 12/21/2015   No results found for: TSH No results found for: HGBA1C Lab Results  Component Value Date   WBC 7.8 10/02/2019   HGB 15.2 10/02/2019   HCT 43.3 10/02/2019   MCV 90 10/02/2019   PLT 204 10/02/2019   Lab Results  Component Value Date   ALT 39 10/02/2019   AST 33 10/02/2019   ALKPHOS 76 10/02/2019   BILITOT 0.2 10/02/2019     Review of Systems  Constitutional: Positive for diaphoresis, fatigue, malaise/fatigue and weight gain. Negative for chills and fever.  HENT: Negative for drooling, ear discharge, ear pain and sore throat.   Eyes: Positive for blurred vision.  Respiratory: Positive for shortness of breath. Negative for cough and wheezing.    Cardiovascular: Positive for palpitations. Negative for chest pain and leg swelling.  Gastrointestinal: Positive for diarrhea. Negative for abdominal pain, blood in stool, constipation and nausea.  Endocrine: Negative for cold intolerance, heat intolerance and polydipsia.  Genitourinary: Negative for dysuria, frequency, hematuria and urgency.  Musculoskeletal: Negative for back pain, myalgias and neck pain.  Skin: Negative for rash.  Allergic/Immunologic: Negative for environmental allergies.  Neurological: Positive for headaches. Negative for dizziness.  Hematological: Does not bruise/bleed easily.  Psychiatric/Behavioral: Negative for suicidal ideas. The patient is not nervous/anxious.     Patient Active Problem List   Diagnosis Date Noted  . Heartburn   . Problems with swallowing and mastication   . Gastritis without bleeding   . Stricture and stenosis of esophagus   . Abnormal feces   . Benign neoplasm of cecum   . Polyp of sigmoid colon   . Chronic prostatitis 09/23/2012  . Hypertrophy of prostate with urinary obstruction and other lower urinary tract symptoms (LUTS) 09/23/2012  . 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 Other specified disorder of male genital organs(608.89) 09/23/2012    No Known Allergies  Past Surgical History:  Procedure Laterality Date  . APPENDECTOMY    . COLONOSCOPY WITH PROPOFOL N/A 08/31/2016   Procedure: COLONOSCOPY WITH PROPOFOL;  Surgeon: Lucilla Lame, MD;  Location: [place];  Service: Endoscopy;  Laterality: N/A;  . ESOPHAGEAL DILATION  08/31/2016   Procedure: ESOPHAGEAL DILATION;  Surgeon:  Lucilla Lame, MD;  Location: [place];  Service: Endoscopy;;  . ESOPHAGOGASTRODUODENOSCOPY (EGD) WITH PROPOFOL N/A 08/31/2016   Procedure: ESOPHAGOGASTRODUODENOSCOPY (EGD) WITH PROPOFOL;  Surgeon: Lucilla Lame, MD;  Location: [place];  Service:  Endoscopy;  Laterality: N/A;    Social History   Tobacco Use  . Smoking status: Never Smoker  . Smokeless tobacco: Never Used  Vaping Use  . Vaping Use: Never used  Substance Use Topics  . Alcohol use: Yes    Alcohol/week: 7.0 standard drinks    Types: 7 Cans of beer per week  . Drug use: No     Medication list has been reviewed and updated.  Current Meds  Medication Sig  . Multiple Vitamin (MULTIVITAMIN) tablet Take 1 tablet by mouth daily.  Marland Kitchen testosterone cypionate (DEPOTESTOSTERONE CYPIONATE) 200 MG/ML injection Inject into the muscle. urology    [facility] 2/9 Scores 02/02/2020 10/01/2019 09/16/2019 09/08/2019  PHQ - 2 Score 0 0 0 0  PHQ- 9 Score 2 0 0 0    GAD 7 : Generalized Anxiety Score 02/02/2020 10/01/2019 09/16/2019 09/08/2019  Nervous, Anxious, on Edge 0 0 0 0  Control/stop worrying 0 0 0 0  Worry too much - different things 0 0 0 0  Trouble relaxing 0 0 0 0  Restless 0 0 0 0  Easily annoyed or irritable 0 0 0 0  Afraid - awful might happen 0 0 0 0  Total GAD 7 Score 0 0 0 0    BP Readings from Last 3 Encounters:  02/02/20 130/80  10/01/19 130/88  09/16/19 (!) 138/100    Physical Exam Vitals and nursing note reviewed.  HENT:     Head: Normocephalic.     Right Ear: Tympanic membrane, ear canal and external ear normal.     Left Ear: Tympanic membrane, ear canal and external ear normal.     Nose: Nose normal. No congestion or rhinorrhea.     Mouth/Throat:     Mouth: Mucous membranes are moist.  Eyes:     General: No scleral icterus.       Right eye: No discharge.        Left eye: No discharge.     Conjunctiva/sclera: Conjunctivae normal.     Pupils: Pupils are equal, round, and reactive to light.  Neck:     Thyroid: No thyromegaly.     Vascular: No carotid bruit or JVD.     Trachea: No tracheal deviation.  Cardiovascular:     Rate and Rhythm: Normal rate and regular rhythm.     Heart sounds: Normal heart sounds. No murmur heard.  No friction rub. No  gallop.   Pulmonary:     Effort: No respiratory distress.     Breath sounds: Normal breath sounds. No wheezing, rhonchi or rales.  Abdominal:     General: Bowel sounds are normal. There is no distension.     Palpations: Abdomen is soft. There is no mass.     Tenderness: There is no abdominal tenderness. There is no right CVA tenderness, left CVA tenderness, guarding or rebound.     Hernia: No hernia is present.  Musculoskeletal:        General: No tenderness. Normal range of motion.     Cervical back: Normal range of motion and neck supple.  Lymphadenopathy:     Cervical: No cervical adenopathy.  Skin:    General: Skin is warm.     Capillary Refill: Capillary refill takes less than 2 seconds.  Coloration: Skin is not jaundiced or pale.     Findings: No rash.  Neurological:     Mental Status: He is alert and oriented to person, place, and time.     Cranial Nerves: No cranial nerve deficit.     Motor: No weakness.     Coordination: Coordination normal.     Deep Tendon Reflexes: Reflexes are normal and symmetric.     Wt Readings from Last 3 Encounters:  02/02/20 222 lb (100.7 kg)  10/01/19 218 lb (98.9 kg)  09/16/19 219 lb (99.3 kg)    BP 130/80   Pulse 80   Ht 5\' 8"  (1.727 m)   Wt 222 lb (100.7 kg)   BMI 33.75 kg/m   Assessment and Plan: 1. Fatigue, unspecified type Chronic.  Persistent.  Stable.  Patient still having some concerns with tiredness and thought that it might be his blood pressure however blood pressure is in a very reasonable range.  Fingerstick glucose was noted to be in acceptable range as well.  We will check a thyroid panel with TSH and we have initiated some discussion with the possibility of depression being a concern.  If TSH is in normal range as well as the thyroid panel her next step may be consideration of a SSRI medication at a low dose initially. - Thyroid Panel With TSH

## 2020-02-04 DIAGNOSIS — R5383 Other fatigue: Secondary | ICD-10-CM | POA: Diagnosis not present

## 2020-02-05 LAB — THYROID PANEL WITH TSH
Free Thyroxine Index: 1.7 (ref 1.2–4.9)
T3 Uptake Ratio: 32 % (ref 24–39)
T4, Total: 5.2 ug/dL (ref 4.5–12.0)
TSH: 0.747 u[IU]/mL (ref 0.450–4.500)

## 2020-02-06 ENCOUNTER — Other Ambulatory Visit: Payer: Self-pay

## 2020-02-06 DIAGNOSIS — F32A Depression, unspecified: Secondary | ICD-10-CM

## 2020-02-06 MED ORDER — SERTRALINE HCL 25 MG PO TABS: 25.0000 mg | ORAL_TABLET | Freq: Every day | ORAL | 0 refills | Status: DC

## 2020-02-06 NOTE — Progress Notes (Unsigned)
Sent in sertraline

## 2020-03-04 ENCOUNTER — Telehealth: Payer: Self-pay

## 2020-03-04 ENCOUNTER — Other Ambulatory Visit: Payer: Self-pay

## 2020-03-04 DIAGNOSIS — F32A Depression, unspecified: Secondary | ICD-10-CM

## 2020-03-04 MED ORDER — SERTRALINE HCL 25 MG PO TABS: 25.0000 mg | ORAL_TABLET | Freq: Every day | ORAL | 0 refills | Status: DC

## 2020-03-04 NOTE — Telephone Encounter (Unsigned)
Copied from [place] [id number]. Topic: General - Inquiry >> Mar 04, 2020 12:02 PM Greggory Keen D wrote: Reason for CRM: pt called saying he was going to run out of his Zoloft before his fu appt on Dec 10th.  [facility]

## 2020-03-04 NOTE — Telephone Encounter (Signed)
Called pt left VM that a RF was sent to [facility]. Also that he would need to come to his appt 12/10 to be able to get any additional Rfs. Pts name was stated on VM.  KP

## 2020-03-11 ENCOUNTER — Ambulatory Visit: Payer: Self-pay | Admitting: Family Medicine

## 2020-03-12 ENCOUNTER — Ambulatory Visit: Payer: Self-pay | Admitting: Family Medicine

## 2020-03-18 ENCOUNTER — Other Ambulatory Visit: Payer: Self-pay

## 2020-03-18 ENCOUNTER — Ambulatory Visit (INDEPENDENT_AMBULATORY_CARE_PROVIDER_SITE_OTHER): Payer: BC Managed Care – PPO | Admitting: Family Medicine

## 2020-03-18 ENCOUNTER — Encounter: Payer: Self-pay | Admitting: Family Medicine

## 2020-03-18 VITALS — BP 128/80 | HR 76 | Ht 68.0 in | Wt 220.0 lb

## 2020-03-18 DIAGNOSIS — Z23 Encounter for immunization: Secondary | ICD-10-CM

## 2020-03-18 DIAGNOSIS — F32A Depression, unspecified: Secondary | ICD-10-CM | POA: Diagnosis not present

## 2020-03-18 MED ORDER — SERTRALINE HCL 50 MG PO TABS: 50.0000 mg | ORAL_TABLET | Freq: Every day | ORAL | 1 refills | Status: DC

## 2020-03-18 NOTE — Progress Notes (Signed)
Date:  03/18/2020   Name:  Johnny Liu   DOB:  Feb 24, 1966   MRN:  [id number]   Chief Complaint: Depression (Increase to 50mg ?) and Flu Vaccine  Depression        This is a chronic problem.  The current episode started more than 1 month ago.   The onset quality is gradual.   The problem has been gradually improving since onset.  Associated symptoms include fatigue.  Associated symptoms include no decreased concentration, no helplessness, no hopelessness, does not have insomnia, not irritable, no restlessness, no decreased interest, no appetite change, no body aches, no myalgias, no headaches, no indigestion, not sad and no suicidal ideas.  Past treatments include SSRIs - Selective serotonin reuptake inhibitors.  Compliance with treatment is good.  Previous treatment provided moderate relief.   Lab Results  Component Value Date   CREATININE 1.12 10/02/2019   BUN 19 10/02/2019   NA 137 10/02/2019   K 4.2 10/02/2019   CL 103 10/02/2019   CO2 21 10/02/2019   Lab Results  Component Value Date   CHOL 156 10/02/2019   HDL 52 10/02/2019   LDLCALC 92 10/02/2019   TRIG 62 10/02/2019   CHOLHDL 3.2 12/21/2015   Lab Results  Component Value Date   TSH 0.747 02/04/2020   No results found for: HGBA1C Lab Results  Component Value Date   WBC 7.8 10/02/2019   HGB 15.2 10/02/2019   HCT 43.3 10/02/2019   MCV 90 10/02/2019   PLT 204 10/02/2019   Lab Results  Component Value Date   ALT 39 10/02/2019   AST 33 10/02/2019   ALKPHOS 76 10/02/2019   BILITOT 0.2 10/02/2019     Review of Systems  Constitutional: Positive for fatigue. Negative for appetite change, chills and fever.  HENT: Negative for congestion, drooling, ear discharge, ear pain and sore throat.   Respiratory: Negative for cough, shortness of breath and wheezing.   Cardiovascular: Negative for chest pain, palpitations and leg swelling.  Gastrointestinal: Negative for abdominal pain, blood in stool, constipation,  diarrhea and nausea.  Endocrine: Negative for polydipsia.  Genitourinary: Negative for dysuria, frequency, hematuria and urgency.  Musculoskeletal: Negative for back pain, myalgias and neck pain.  Skin: Negative for rash.  Allergic/Immunologic: Negative for environmental allergies.  Neurological: Negative for dizziness and headaches.  Hematological: Does not bruise/bleed easily.  Psychiatric/Behavioral: Positive for depression. Negative for decreased concentration and suicidal ideas. The patient is not nervous/anxious and does not have insomnia.     Patient Active Problem List   Diagnosis Date Noted  . Heartburn   . Problems with swallowing and mastication   . Gastritis without bleeding   . Stricture and stenosis of esophagus   . Abnormal feces   . Benign neoplasm of cecum   . Polyp of sigmoid colon   . Chronic prostatitis 09/23/2012  . Hypertrophy of prostate with urinary obstruction and other lower urinary tract symptoms (LUTS) 09/23/2012  . 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 Other specified disorder of male genital organs(608.89) 09/23/2012    No Known Allergies  Past Surgical History:  Procedure Laterality Date  . APPENDECTOMY    . COLONOSCOPY WITH PROPOFOL N/A 08/31/2016   Procedure: COLONOSCOPY WITH PROPOFOL;  Surgeon: Lucilla Lame, MD;  Location: [place];  Service: Endoscopy;  Laterality: N/A;  . ESOPHAGEAL DILATION  08/31/2016   Procedure: ESOPHAGEAL DILATION;  Surgeon: Lucilla Lame, MD;  Location: [place];  Service: Endoscopy;;  . ESOPHAGOGASTRODUODENOSCOPY (EGD) WITH PROPOFOL N/A 08/31/2016   Procedure: ESOPHAGOGASTRODUODENOSCOPY (EGD) WITH PROPOFOL;  Surgeon: Lucilla Lame, MD;  Location: [place];  Service: Endoscopy;  Laterality: N/A;    Social History   Tobacco Use  . Smoking status: Never Smoker  . Smokeless tobacco: Never Used  Vaping Use  . Vaping  Use: Never used  Substance Use Topics  . Alcohol use: Yes    Alcohol/week: 7.0 standard drinks    Types: 7 Cans of beer per week  . Drug use: No     Medication list has been reviewed and updated.  Current Meds  Medication Sig  . Multiple Vitamin (MULTIVITAMIN) tablet Take 1 tablet by mouth daily.  . sertraline (ZOLOFT) 25 MG tablet Take 1 tablet (25 mg total) by mouth daily.  Marland Kitchen testosterone cypionate (DEPOTESTOSTERONE CYPIONATE) 200 MG/ML injection Inject into the muscle. urology    [facility] 2/9 Scores 03/18/2020 02/02/2020 10/01/2019 09/16/2019  PHQ - 2 Score 0 0 0 0  PHQ- 9 Score 0 2 0 0    GAD 7 : Generalized Anxiety Score 03/18/2020 02/02/2020 10/01/2019 09/16/2019  Nervous, Anxious, on Edge 0 0 0 0  Control/stop worrying 0 0 0 0  Worry too much - different things 0 0 0 0  Trouble relaxing 0 0 0 0  Restless 0 0 0 0  Easily annoyed or irritable 0 0 0 0  Afraid - awful might happen 0 0 0 0  Total GAD 7 Score 0 0 0 0    BP Readings from Last 3 Encounters:  03/18/20 128/80  02/02/20 130/80  10/01/19 130/88    Physical Exam Vitals and nursing note reviewed.  Constitutional:      General: He is not irritable. HENT:     Head: Normocephalic.     Right Ear: Tympanic membrane, ear canal and external ear normal.     Left Ear: Tympanic membrane, ear canal and external ear normal.     Nose: Nose normal. No congestion or rhinorrhea.     Mouth/Throat:     Mouth: Oropharynx is clear and moist. Mucous membranes are moist.  Eyes:     General: No scleral icterus.       Right eye: No discharge.        Left eye: No discharge.     Extraocular Movements: EOM normal.     Conjunctiva/sclera: Conjunctivae normal.     Pupils: Pupils are equal, round, and reactive to light.  Neck:     Thyroid: No thyromegaly.     Vascular: No JVD.     Trachea: No tracheal deviation.  Cardiovascular:     Rate and Rhythm: Normal rate and regular rhythm.     Pulses: Intact distal pulses.     Heart sounds:  Normal heart sounds. No murmur heard. No friction rub. No gallop.   Pulmonary:     Effort: No respiratory distress.     Breath sounds: Normal breath sounds. No wheezing, rhonchi or rales.  Abdominal:     General: Bowel sounds are normal.     Palpations: Abdomen is soft. There is no hepatosplenomegaly or mass.     Tenderness: There is no abdominal tenderness. There is no CVA tenderness, guarding or rebound.  Musculoskeletal:        General: No tenderness or edema. Normal range of motion.     Cervical back: Normal range of motion and neck supple.  Lymphadenopathy:  Cervical: No cervical adenopathy.  Skin:    General: Skin is warm.     Findings: No rash.  Neurological:     Mental Status: He is alert and oriented to person, place, and time.     Cranial Nerves: No cranial nerve deficit.     Deep Tendon Reflexes: Strength normal and reflexes are normal and symmetric.     Wt Readings from Last 3 Encounters:  03/18/20 220 lb (99.8 kg)  02/02/20 222 lb (100.7 kg)  10/01/19 218 lb (98.9 kg)    BP 128/80   Pulse 76   Ht 5\' 8"  (1.727 m)   Wt 220 lb (99.8 kg)   BMI 33.45 kg/m   Assessment and Plan: 1. Depression, unspecified depression type Chronic.  Persistent.  Stable.  PHQ has improved to zero from four from eleven/ one encounter.  Patient was initially started on 25 mg sertraline and continued on that and has noticed after a week adjustment that he has felt better at the 25 mg but like would to increase to the next dosage.  We will increase to 50 mg once a day and will recheck patient in 6 months. - sertraline (ZOLOFT) 50 MG tablet; Take 1 tablet (50 mg total) by mouth daily.  Dispense: 90 tablet; Refill: 1

## 2020-03-18 NOTE — Patient Instructions (Signed)
Mediterranean Diet A Mediterranean diet refers to food and lifestyle choices that are based on the traditions of countries located on the [place]. This way of eating has been shown to help prevent certain conditions and improve outcomes for people who have chronic diseases, like kidney disease and heart disease. What are tips for following this plan? Lifestyle  Cook and eat meals together with your family, when possible.  Drink enough fluid to keep your urine clear or pale yellow.  Be physically active every day. This includes: ? Aerobic exercise like running or swimming. ? Leisure activities like gardening, walking, or housework.  Get 7-8 hours of sleep each night.  If recommended by your health care provider, drink red wine in moderation. This means 1 glass a day for nonpregnant women and 2 glasses a day for men. A glass of wine equals 5 oz (150 mL). Reading food labels  Check the serving size of packaged foods. For foods such as rice and pasta, the serving size refers to the amount of cooked product, not dry.  Check the total fat in packaged foods. Avoid foods that have saturated fat or trans fats.  Check the ingredients list for added sugars, such as corn syrup.   Shopping  At the grocery store, buy most of your food from the areas near the walls of the store. This includes: ? Fresh fruits and vegetables (produce). ? Grains, beans, nuts, and seeds. Some of these may be available in unpackaged forms or large amounts (in bulk). ? Fresh seafood. ? Poultry and eggs. ? Low-fat dairy products.  Buy whole ingredients instead of prepackaged foods.  Buy fresh fruits and vegetables in-season from local farmers markets.  Buy frozen fruits and vegetables in resealable bags.  If you do not have access to quality fresh seafood, buy precooked frozen shrimp or canned fish, such as tuna, salmon, or sardines.  Buy small amounts of raw or cooked vegetables, salads, or olives from  the deli or salad bar at your store.  Stock your pantry so you always have certain foods on hand, such as olive oil, canned tuna, canned tomatoes, rice, pasta, and beans. Cooking  Cook foods with extra-virgin olive oil instead of using butter or other vegetable oils.  Have meat as a side dish, and have vegetables or grains as your main dish. This means having meat in small portions or adding small amounts of meat to foods like pasta or stew.  Use beans or vegetables instead of meat in common dishes like chili or lasagna.  Experiment with different cooking methods. Try roasting or broiling vegetables instead of steaming or sauteing them.  Add frozen vegetables to soups, stews, pasta, or rice.  Add nuts or seeds for added healthy fat at each meal. You can add these to yogurt, salads, or vegetable dishes.  Marinate fish or vegetables using olive oil, lemon juice, garlic, and fresh herbs. Meal planning  Plan to eat 1 vegetarian meal one day each week. Try to work up to 2 vegetarian meals, if possible.  Eat seafood 2 or more times a week.  Have healthy snacks readily available, such as: ? Vegetable sticks with hummus. ? Greek yogurt. ? Fruit and nut trail mix.  Eat balanced meals throughout the week. This includes: ? Fruit: 2-3 servings a day ? Vegetables: 4-5 servings a day ? Low-fat dairy: 2 servings a day ? Fish, poultry, or lean meat: 1 serving a day ? Beans and legumes: 2 or more servings a week ?   Nuts and seeds: 1-2 servings a day ? Whole grains: 6-8 servings a day ? Extra-virgin olive oil: 3-4 servings a day  Limit red meat and sweets to only a few servings a month   What are my food choices?  Mediterranean diet ? Recommended  Grains: Whole-grain pasta. Brown rice. Bulgar wheat. Polenta. Couscous. Whole-wheat bread. Oatmeal. Quinoa.  Vegetables: Artichokes. Beets. Broccoli. Cabbage. Carrots. Eggplant. Green beans. Chard. Kale. Spinach. Onions. Leeks. Peas. Squash.  Tomatoes. Peppers. Radishes.  Fruits: Apples. Apricots. Avocado. Berries. Bananas. Cherries. Dates. Figs. Grapes. Lemons. Melon. Oranges. Peaches. Plums. Pomegranate.  Meats and other protein foods: Beans. Almonds. Sunflower seeds. Pine nuts. Peanuts. Cod. Salmon. Scallops. Shrimp. Tuna. Tilapia. Clams. Oysters. Eggs.  Dairy: Low-fat milk. Cheese. Greek yogurt.  Beverages: Water. Red wine. Herbal tea.  Fats and oils: Extra virgin olive oil. Avocado oil. Grape seed oil.  Sweets and desserts: Greek yogurt with honey. Baked apples. Poached pears. Trail mix.  Seasoning and other foods: Basil. Cilantro. Coriander. Cumin. Mint. Parsley. Sage. Rosemary. Tarragon. Garlic. Oregano. Thyme. Pepper. Balsalmic vinegar. Tahini. Hummus. Tomato sauce. Olives. Mushrooms. ? Limit these  Grains: Prepackaged pasta or rice dishes. Prepackaged cereal with added sugar.  Vegetables: Deep fried potatoes (french fries).  Fruits: Fruit canned in syrup.  Meats and other protein foods: Beef. Pork. Lamb. Poultry with skin. Hot dogs. Bacon.  Dairy: Ice cream. Sour cream. Whole milk.  Beverages: Juice. Sugar-sweetened soft drinks. Beer. Liquor and spirits.  Fats and oils: Butter. Canola oil. Vegetable oil. Beef fat (tallow). Lard.  Sweets and desserts: Cookies. Cakes. Pies. Candy.  Seasoning and other foods: Mayonnaise. Premade sauces and marinades. The items listed may not be a complete list. Talk with your dietitian about what dietary choices are right for you. Summary  The Mediterranean diet includes both food and lifestyle choices.  Eat a variety of fresh fruits and vegetables, beans, nuts, seeds, and whole grains.  Limit the amount of red meat and sweets that you eat.  Talk with your health care provider about whether it is safe for you to drink red wine in moderation. This means 1 glass a day for nonpregnant women and 2 glasses a day for men. A glass of wine equals 5 oz (150 mL). This information  is not intended to replace advice given to you by your health care provider. Make sure you discuss any questions you have with your health care provider. Document Revised: 11/18/2015 Document Reviewed: 11/11/2015 Elsevier Patient Education  2020 Elsevier Inc.  

## 2020-06-22 DIAGNOSIS — D0359 Melanoma in situ of other part of trunk: Secondary | ICD-10-CM | POA: Diagnosis not present

## 2020-06-22 DIAGNOSIS — L578 Other skin changes due to chronic exposure to nonionizing radiation: Secondary | ICD-10-CM | POA: Diagnosis not present

## 2020-06-22 DIAGNOSIS — Z872 Personal history of diseases of the skin and subcutaneous tissue: Secondary | ICD-10-CM | POA: Diagnosis not present

## 2020-06-22 DIAGNOSIS — D485 Neoplasm of uncertain behavior of skin: Secondary | ICD-10-CM | POA: Diagnosis not present

## 2020-06-22 DIAGNOSIS — L570 Actinic keratosis: Secondary | ICD-10-CM | POA: Diagnosis not present

## 2020-06-22 DIAGNOSIS — C44319 Basal cell carcinoma of skin of other parts of face: Secondary | ICD-10-CM | POA: Diagnosis not present

## 2020-07-26 DIAGNOSIS — C4359 Malignant melanoma of other part of trunk: Secondary | ICD-10-CM | POA: Diagnosis not present

## 2020-07-26 DIAGNOSIS — D0359 Melanoma in situ of other part of trunk: Secondary | ICD-10-CM | POA: Diagnosis not present

## 2020-08-27 DIAGNOSIS — M79605 Pain in left leg: Secondary | ICD-10-CM | POA: Diagnosis not present

## 2020-09-13 ENCOUNTER — Telehealth: Payer: Self-pay | Admitting: Family Medicine

## 2020-09-21 ENCOUNTER — Ambulatory Visit: Payer: BC Managed Care – PPO | Admitting: Family Medicine

## 2020-09-23 ENCOUNTER — Other Ambulatory Visit: Payer: Self-pay

## 2020-09-23 ENCOUNTER — Ambulatory Visit (INDEPENDENT_AMBULATORY_CARE_PROVIDER_SITE_OTHER): Payer: BC Managed Care – PPO | Admitting: Family Medicine

## 2020-09-23 ENCOUNTER — Encounter: Payer: Self-pay | Admitting: Family Medicine

## 2020-09-23 VITALS — BP 130/80 | HR 80 | Ht 68.0 in | Wt 225.0 lb

## 2020-09-23 DIAGNOSIS — F32A Depression, unspecified: Secondary | ICD-10-CM | POA: Diagnosis not present

## 2020-09-23 DIAGNOSIS — R41840 Attention and concentration deficit: Secondary | ICD-10-CM

## 2020-09-23 NOTE — Progress Notes (Signed)
Date:  09/23/2020   Name:  Johnny Liu   DOB:  [birth year]   MRN:  [id number]   Chief Complaint: Depression (Sertraline made him feel worse- blah)  Depression        This is a chronic problem.  The current episode started more than 1 year ago.   The onset quality is sudden.   The problem occurs intermittently.  The problem has been gradually improving since onset.  Associated symptoms include no decreased concentration, no fatigue, no helplessness, no hopelessness, does not have insomnia, not irritable, no restlessness, no decreased interest, no appetite change, no body aches, no myalgias, no headaches, no indigestion, not sad and no suicidal ideas.  Past treatments include nothing.  Previous treatment provided mild relief.  Lab Results  Component Value Date   CREATININE 1.12 10/02/2019   BUN 19 10/02/2019   NA 137 10/02/2019   K 4.2 10/02/2019   CL 103 10/02/2019   CO2 21 10/02/2019   Lab Results  Component Value Date   CHOL 156 10/02/2019   HDL 52 10/02/2019   LDLCALC 92 10/02/2019   TRIG 62 10/02/2019   CHOLHDL 3.2 12/21/2015   Lab Results  Component Value Date   TSH 0.747 02/04/2020   No results found for: HGBA1C Lab Results  Component Value Date   WBC 7.8 10/02/2019   HGB 15.2 10/02/2019   HCT 43.3 10/02/2019   MCV 90 10/02/2019   PLT 204 10/02/2019   Lab Results  Component Value Date   ALT 39 10/02/2019   AST 33 10/02/2019   ALKPHOS 76 10/02/2019   BILITOT 0.2 10/02/2019     Review of Systems  Constitutional:  Negative for appetite change, chills, fatigue and fever.  HENT:  Negative for drooling, ear discharge, ear pain and sore throat.   Respiratory:  Negative for cough, shortness of breath and wheezing.   Cardiovascular:  Negative for chest pain, palpitations and leg swelling.  Gastrointestinal:  Negative for abdominal pain, blood in stool, constipation, diarrhea and nausea.  Endocrine: Negative for polydipsia.  Genitourinary:  Negative for dysuria,  frequency, hematuria and urgency.  Musculoskeletal:  Negative for back pain, myalgias and neck pain.  Skin:  Negative for rash.  Allergic/Immunologic: Negative for environmental allergies.  Neurological:  Negative for dizziness and headaches.  Hematological:  Does not bruise/bleed easily.  Psychiatric/Behavioral:  Positive for depression. Negative for decreased concentration and suicidal ideas. The patient is not nervous/anxious and does not have insomnia.    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   Other specified disorder of male genital organs(608.89) 09/23/2012    No Known Allergies  Past Surgical History:  Procedure Laterality Date   APPENDECTOMY     COLONOSCOPY WITH PROPOFOL N/A 08/31/2016   Procedure: COLONOSCOPY WITH PROPOFOL;  Surgeon: Lucilla Lame, MD;  Location: [place];  Service: Endoscopy;  Laterality: N/A;   ESOPHAGEAL DILATION  08/31/2016   Procedure: ESOPHAGEAL DILATION;  Surgeon: Lucilla Lame, MD;  [facility];  Service: Endoscopy;;   ESOPHAGOGASTRODUODENOSCOPY (EGD) WITH PROPOFOL N/A 08/31/2016   Procedure: ESOPHAGOGASTRODUODENOSCOPY (EGD) WITH PROPOFOL;  Surgeon: Lucilla Lame, MD;  Location: [place];  Service: Endoscopy;  Laterality: N/A;    Social History   Tobacco Use   Smoking status: Never   Smokeless tobacco: Never  Vaping Use   Vaping Use: Never used  Substance Use Topics   Alcohol use: Yes    Alcohol/week: 7.0 standard drinks    Types: 7 Cans of beer  per week   Drug use: No     Medication list has been reviewed and updated.  Current Meds  Medication Sig   Multiple Vitamin (MULTIVITAMIN) tablet Take 1 tablet by mouth daily.   testosterone cypionate (DEPOTESTOSTERONE CYPIONATE) 200 MG/ML injection Inject into the muscle. urology    [facility] 2/9 Scores 09/23/2020 03/18/2020 02/02/2020 10/01/2019  PHQ - 2 Score 0 0 0 0  PHQ- 9 Score 3 0 2 0    GAD 7 : Generalized Anxiety Score 09/23/2020 03/18/2020 02/02/2020 10/01/2019  Nervous, Anxious, on Edge 0 0 0 0  Control/stop worrying 0 0 0 0  Worry too much - different things 0 0 0 0  Trouble relaxing 0 0 0 0  Restless 0 0 0 0  Easily annoyed or irritable 0 0 0 0  Afraid - awful might happen 0 0 0 0  Total GAD 7 Score 0 0 0 0    BP Readings from Last 3 Encounters:  09/23/20 130/80  03/18/20 128/80  02/02/20 130/80    Physical Exam Vitals and nursing note reviewed.  Constitutional:      General: He is not irritable. HENT:     Head: Normocephalic.     Right Ear: Tympanic membrane, ear canal and external ear normal. There is no impacted cerumen.     Left Ear: Tympanic membrane, ear canal and external ear normal. There is no impacted cerumen.     Nose: Nose normal. No congestion or rhinorrhea.  Eyes:     General: No scleral icterus.       Right eye: No discharge.        Left eye: No discharge.     Conjunctiva/sclera: Conjunctivae normal.     Pupils: Pupils are equal, round, and reactive to light.  Neck:     Thyroid: No thyromegaly.     Vascular: No JVD.     Trachea: No tracheal deviation.  Cardiovascular:     Rate and Rhythm: Normal rate and regular rhythm.     Heart sounds: Normal heart sounds, S1 normal and S2 normal. No murmur heard. No systolic murmur is present.  No diastolic murmur is present.    No friction rub. No gallop. No S3 or S4 sounds.  Pulmonary:     Effort: No respiratory distress.     Breath sounds: Normal breath sounds. No wheezing or rales.  Abdominal:      General: Bowel sounds are normal.     Palpations: Abdomen is soft. There is no mass.     Tenderness: There is no abdominal tenderness. There is no guarding or rebound.  Musculoskeletal:        General: No tenderness. Normal range of motion.     Cervical back: Normal range of motion and neck supple.     Comments: Slight more prominence left fibular head  Lymphadenopathy:     Cervical: No cervical adenopathy.  Skin:    General: Skin is warm.     Findings:  No rash.  Neurological:     Mental Status: He is alert and oriented to person, place, and time.     Cranial Nerves: No cranial nerve deficit.     Deep Tendon Reflexes: Reflexes are normal and symmetric.    Wt Readings from Last 3 Encounters:  09/23/20 225 lb (102.1 kg)  03/18/20 220 lb (99.8 kg)  02/02/20 222 lb (100.7 kg)    BP 130/80   Pulse 80   Ht 5\' 8"  (1.727 m)   Wt 225 lb (102.1 kg)   BMI 34.21 kg/m   Assessment and Plan:   1. Attention disturbance Relatively new onset.  Persistent.  Uncontrolled.  Patient has noticed that he is having more more difficulty "focusing" and that this is becoming more more difficult given the extended hours that he is having to work as well as the level of acuity.  We will refer to psychiatry as patient will check his insurance as to who would be within his network for evaluation for attention deficit.  2. Depression, unspecified depression type Chronic.  Controlled.  Stable.  Patient is currently off his medication and PHQ is 3 with a gad score of 0.  We will continue off medication as noted by patient but will recheck on a periodic basis.

## 2020-12-31 DIAGNOSIS — E291 Testicular hypofunction: Secondary | ICD-10-CM | POA: Diagnosis not present

## 2020-12-31 DIAGNOSIS — N529 Male erectile dysfunction, unspecified: Secondary | ICD-10-CM | POA: Diagnosis not present

## 2021-01-14 ENCOUNTER — Other Ambulatory Visit: Payer: Self-pay

## 2021-01-14 ENCOUNTER — Ambulatory Visit
Admission: RE | Admit: 2021-01-14 | Discharge: 2021-01-14 | Disposition: A | Discharge status: Home / Self Care | Payer: BC Managed Care – PPO | Source: Ambulatory Visit | Attending: Family Medicine | Admitting: Family Medicine

## 2021-01-14 ENCOUNTER — Ambulatory Visit
Admission: RE | Admit: 2021-01-14 | Discharge: 2021-01-14 | Disposition: A | Discharge status: Home / Self Care | Payer: BC Managed Care – PPO | Attending: Family Medicine | Admitting: Family Medicine

## 2021-01-14 ENCOUNTER — Ambulatory Visit (INDEPENDENT_AMBULATORY_CARE_PROVIDER_SITE_OTHER): Payer: BC Managed Care – PPO | Admitting: Family Medicine

## 2021-01-14 ENCOUNTER — Encounter: Payer: Self-pay | Admitting: Family Medicine

## 2021-01-14 VITALS — BP 134/84 | HR 98 | Ht 68.0 in | Wt 226.0 lb

## 2021-01-14 DIAGNOSIS — Z23 Encounter for immunization: Secondary | ICD-10-CM

## 2021-01-14 DIAGNOSIS — Z0000 Encounter for general adult medical examination without abnormal findings: Secondary | ICD-10-CM

## 2021-01-14 DIAGNOSIS — D169 Benign neoplasm of bone and articular cartilage, unspecified: Secondary | ICD-10-CM | POA: Diagnosis not present

## 2021-01-14 DIAGNOSIS — Z1211 Encounter for screening for malignant neoplasm of colon: Secondary | ICD-10-CM

## 2021-01-14 DIAGNOSIS — R2242 Localized swelling, mass and lump, left lower limb: Secondary | ICD-10-CM | POA: Diagnosis not present

## 2021-01-14 NOTE — Progress Notes (Signed)
Date:  01/14/2021   Name:  Johnny Liu   DOB:  April 19, 1965   MRN:  [id number]   Chief Complaint: Annual Exam and Flu Vaccine  Patient is a 55 year old male who presents for a biometric exam. The patient reports the following problems: proteinuria. Health maintenance has been reviewed up to date.     Lab Results  Component Value Date   CREATININE 1.12 10/02/2019   BUN 19 10/02/2019   NA 137 10/02/2019   K 4.2 10/02/2019   CL 103 10/02/2019   CO2 21 10/02/2019   Lab Results  Component Value Date   CHOL 156 10/02/2019   HDL 52 10/02/2019   LDLCALC 92 10/02/2019   TRIG 62 10/02/2019   CHOLHDL 3.2 12/21/2015   Lab Results  Component Value Date   TSH 0.747 02/04/2020   No results found for: HGBA1C Lab Results  Component Value Date   WBC 7.8 10/02/2019   HGB 15.2 10/02/2019   HCT 43.3 10/02/2019   MCV 90 10/02/2019   PLT 204 10/02/2019   Lab Results  Component Value Date   ALT 39 10/02/2019   AST 33 10/02/2019   ALKPHOS 76 10/02/2019   BILITOT 0.2 10/02/2019     Review of Systems  Constitutional:  Negative for chills and fever.  HENT:  Negative for drooling, ear discharge, ear pain and sore throat.   Respiratory:  Negative for cough, shortness of breath and wheezing.   Cardiovascular:  Negative for chest pain, palpitations and leg swelling.  Gastrointestinal:  Negative for abdominal pain, blood in stool, constipation, diarrhea and nausea.  Endocrine: Negative for polydipsia.  Genitourinary:  Negative for dysuria, frequency, hematuria and urgency.  Musculoskeletal:  Negative for back pain, myalgias and neck pain.  Skin:  Negative for rash.  Allergic/Immunologic: Negative for environmental allergies.  Neurological:  Negative for dizziness and headaches.  Hematological:  Does not bruise/bleed easily.  Psychiatric/Behavioral:  Negative for suicidal ideas. The patient is not nervous/anxious.    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) 09/23/2012    No Known Allergies  Past Surgical History:  Procedure Laterality Date   APPENDECTOMY     COLONOSCOPY WITH PROPOFOL N/A 08/31/2016   Procedure: COLONOSCOPY WITH PROPOFOL;  Surgeon: Lucilla Lame, MD;  Location: [facility];  Service: Endoscopy;  Laterality: N/A;   ESOPHAGEAL DILATION  08/31/2016   Procedure: ESOPHAGEAL DILATION;  Surgeon: Lucilla Lame, MD;  Location: [facility];  Service: Endoscopy;;   ESOPHAGOGASTRODUODENOSCOPY (EGD) WITH PROPOFOL N/A 08/31/2016   Procedure: ESOPHAGOGASTRODUODENOSCOPY (EGD) WITH PROPOFOL;  Surgeon: Lucilla Lame, MD;  Location: [facility];  Service: Endoscopy;  Laterality: N/A;    Social History   Tobacco Use   Smoking status: Never   Smokeless tobacco: Never  Vaping Use   Vaping Use: Never used  Substance Use Topics   Alcohol use: Yes    Alcohol/week: 7.0 standard drinks  Types: 7 Cans of beer per week   Drug use: No     Medication list has been reviewed and updated.  Current Meds  Medication Sig   Multiple Vitamin (MULTIVITAMIN) tablet Take 1 tablet by mouth daily.   testosterone cypionate (DEPOTESTOSTERONE CYPIONATE) 200 MG/ML injection Inject into the muscle. urology    [facility] 2/9 Scores 09/23/2020 03/18/2020 02/02/2020 10/01/2019  PHQ - 2 Score 0 0 0 0  PHQ- 9 Score 3 0 2 0    GAD 7 : Generalized Anxiety Score 09/23/2020 03/18/2020 02/02/2020 10/01/2019  Nervous, Anxious, on Edge 0 0 0 0  Control/stop  worrying 0 0 0 0  Worry too much - different things 0 0 0 0  Trouble relaxing 0 0 0 0  Restless 0 0 0 0  Easily annoyed or irritable 0 0 0 0  Afraid - awful might happen 0 0 0 0  Total GAD 7 Score 0 0 0 0    BP Readings from Last 3 Encounters:  01/14/21 134/84  09/23/20 130/80  03/18/20 128/80    Physical Exam Vitals and nursing note reviewed.  HENT:     Head: Normocephalic.     Right Ear: Tympanic membrane, ear canal and external ear normal.     Left Ear: Tympanic membrane, ear canal and external ear normal.     Nose: Nose normal. No congestion or rhinorrhea.  Eyes:     General: No scleral icterus.       Right eye: No discharge.        Left eye: No discharge.     Conjunctiva/sclera: Conjunctivae normal.     Pupils: Pupils are equal, round, and reactive to light.  Neck:     Thyroid: No thyromegaly.     Vascular: No JVD.     Trachea: No tracheal deviation.  Cardiovascular:     Rate and Rhythm: Normal rate and regular rhythm.     Heart sounds: Normal heart sounds. No murmur heard.   No friction rub. No gallop.  Pulmonary:     Effort: No respiratory distress.     Breath sounds: Normal breath sounds. No wheezing, rhonchi or rales.  Abdominal:     General: Bowel sounds are normal.     Palpations: Abdomen is soft. There is no mass.     Tenderness: There is no abdominal tenderness. There is no guarding or rebound.  Musculoskeletal:        General: No tenderness. Normal range of motion.     Cervical back: Normal range of motion and neck supple.  Lymphadenopathy:     Cervical: No cervical adenopathy.  Skin:    General: Skin is warm.     Findings: No rash.  Neurological:     Mental Status: He is alert and oriented to person, place, and time.     Cranial Nerves: No cranial nerve deficit.     Deep Tendon Reflexes: Reflexes are normal and symmetric.    Wt Readings from Last 3 Encounters:  01/14/21 226 lb (102.5 kg)  09/23/20 225 lb (102.1 kg)  03/18/20 220 lb (99.8 kg)     BP 134/84   Pulse 98   Ht 5\' 8"  (1.727 m)   Wt 226 lb (102.5 kg)   BMI 34.36 kg/m   Assessment and Plan: Johnny Liu is a 55 y.o. male who presents today for his Complete Annual Exam. He feels well. He reports exercising daily. He reports he is sleeping well.  Patient's chart was reviewed  for previous encounters most recent labs most recent procedures most recent imaging in [place]. 1. Annual physical exam Immunizations are reviewed and recommendations provided.   Age appropriate screening tests are discussed. Counseling given for risk factor reduction interventions.  No subjective/objective concerns noted other than below.  There is an area that is going to be addressed of his knee as well as colon cancer screening.  We will obtain labs consisting of renal function panel lipid panel A1c CBC and microalbuminuria. - Renal Function Panel - Lipid Panel With LDL/HDL Ratio - HgB A1c - CBC with Differential/Platelet - Microalbumin, urine  2. Colon cancer screening Guaiac was noted to be positive and this was discussed with patient and since his last colonoscopy was in 2018 and approaching 5 years and may of 23.  We will bring it to the attention of gastroenterology and we will check CBC. - Ambulatory referral to Gastroenterology  3. Osteochondroma There is a nontender nodule on the lateral aspect of left knee consistent with osteochondroma.  We will obtain an x-ray to rule out cyst and if necessary refer to orthopedic/sports medicine. - DG Knee 1-2 Views Left; Future  4. Need for immunization against influenza Discussed and administered. - Flu Vaccine QUAD 56mo+IM (Fluarix, Fluzone & Alfiuria Quad PF)

## 2021-01-17 DIAGNOSIS — Z0000 Encounter for general adult medical examination without abnormal findings: Secondary | ICD-10-CM | POA: Diagnosis not present

## 2021-01-18 LAB — HEMOGLOBIN A1C
Est. average glucose Bld gHb Est-mCnc: 114 mg/dL
Hgb A1c MFr Bld: 5.6 % (ref 4.8–5.6)

## 2021-01-18 LAB — CBC WITH DIFFERENTIAL/PLATELET
Basophils Absolute: 0.1 10*3/uL (ref 0.0–0.2)
Basos: 1 %
EOS (ABSOLUTE): 0.3 10*3/uL (ref 0.0–0.4)
Eos: 5 %
Hematocrit: 48 % (ref 37.5–51.0)
Hemoglobin: 16.5 g/dL (ref 13.0–17.7)
Immature Grans (Abs): 0 10*3/uL (ref 0.0–0.1)
Immature Granulocytes: 0 %
Lymphocytes Absolute: 1.9 10*3/uL (ref 0.7–3.1)
Lymphs: 28 %
MCH: 30.8 pg (ref 26.6–33.0)
MCHC: 34.4 g/dL (ref 31.5–35.7)
MCV: 90 fL (ref 79–97)
Monocytes Absolute: 0.6 10*3/uL (ref 0.1–0.9)
Monocytes: 9 %
Neutrophils Absolute: 3.9 10*3/uL (ref 1.4–7.0)
Neutrophils: 57 %
Platelets: 213 10*3/uL (ref 150–450)
RBC: 5.36 x10E6/uL (ref 4.14–5.80)
RDW: 12.2 % (ref 11.6–15.4)
WBC: 6.8 10*3/uL (ref 3.4–10.8)

## 2021-01-18 LAB — LIPID PANEL WITH LDL/HDL RATIO
Cholesterol, Total: 146 mg/dL (ref 100–199)
HDL: 53 mg/dL (ref >39)
LDL Chol Calc (NIH): 84 mg/dL (ref 0–99)
LDL/HDL Ratio: 1.6 ratio (ref 0.0–3.6)
Triglycerides: 37 mg/dL (ref 0–149)
VLDL Cholesterol Cal: 9 mg/dL (ref 5–40)

## 2021-01-18 LAB — RENAL FUNCTION PANEL
Albumin: 4.8 g/dL (ref 3.8–4.9)
BUN/Creatinine Ratio: 12 (ref 9–20)
BUN: 12 mg/dL (ref 6–24)
CO2: 23 mmol/L (ref 20–29)
Calcium: 9.1 mg/dL (ref 8.7–10.2)
Chloride: 101 mmol/L (ref 96–106)
Creatinine, Ser: 1.04 mg/dL (ref 0.76–1.27)
Glucose: 118 mg/dL — ABNORMAL HIGH (ref 70–99)
Phosphorus: 3 mg/dL (ref 2.8–4.1)
Potassium: 4.8 mmol/L (ref 3.5–5.2)
Sodium: 137 mmol/L (ref 134–144)
eGFR: 85 mL/min/{1.73_m2} (ref >59)

## 2021-01-18 LAB — MICROALBUMIN, URINE: Microalbumin, Urine: 17.1 ug/mL

## 2021-01-19 ENCOUNTER — Other Ambulatory Visit: Payer: Self-pay

## 2021-01-19 DIAGNOSIS — N289 Disorder of kidney and ureter, unspecified: Secondary | ICD-10-CM

## 2021-01-19 MED ORDER — LISINOPRIL 2.5 MG PO TABS: 2.5000 mg | ORAL_TABLET | Freq: Every day | ORAL | 0 refills | Status: DC

## 2021-01-19 NOTE — Progress Notes (Signed)
Sent in lisinopril to [facility]

## 2021-01-31 ENCOUNTER — Telehealth: Payer: Self-pay

## 2021-01-31 NOTE — Telephone Encounter (Signed)
Copied from [place] [id number]. Topic: General - Other >> Jan 31, 2021 10:08 AM Valere Dross wrote: Reason for CRM: Pt called in wanting a nurse to give him a call back about his labs, please advise.

## 2021-03-23 ENCOUNTER — Ambulatory Visit: Payer: Self-pay | Admitting: Family Medicine

## 2021-04-03 DIAGNOSIS — C4491 Basal cell carcinoma of skin, unspecified: Secondary | ICD-10-CM

## 2021-04-03 HISTORY — DX: Basal cell carcinoma of skin, unspecified: C44.91

## 2021-04-14 DIAGNOSIS — C4491 Basal cell carcinoma of skin, unspecified: Secondary | ICD-10-CM | POA: Diagnosis not present

## 2021-06-20 DIAGNOSIS — E291 Testicular hypofunction: Secondary | ICD-10-CM | POA: Diagnosis not present

## 2021-06-20 DIAGNOSIS — Z6835 Body mass index (BMI) 35.0-35.9, adult: Secondary | ICD-10-CM | POA: Diagnosis not present

## 2021-06-20 DIAGNOSIS — N529 Male erectile dysfunction, unspecified: Secondary | ICD-10-CM | POA: Diagnosis not present

## 2021-06-29 DIAGNOSIS — E291 Testicular hypofunction: Secondary | ICD-10-CM | POA: Diagnosis not present

## 2021-07-06 ENCOUNTER — Other Ambulatory Visit: Payer: Self-pay

## 2021-07-06 ENCOUNTER — Encounter: Payer: Self-pay | Admitting: Family Medicine

## 2021-07-06 ENCOUNTER — Other Ambulatory Visit: Payer: Self-pay | Admitting: Family Medicine

## 2021-07-06 DIAGNOSIS — M545 Low back pain, unspecified: Secondary | ICD-10-CM

## 2021-07-06 DIAGNOSIS — N289 Disorder of kidney and ureter, unspecified: Secondary | ICD-10-CM

## 2021-07-06 MED ORDER — CYCLOBENZAPRINE HCL 5 MG PO TABS: 5.0000 mg | ORAL_TABLET | Freq: Three times a day (TID) | ORAL | 1 refills | Status: DC | PRN

## 2021-07-06 MED ORDER — LISINOPRIL 2.5 MG PO TABS: 2.5000 mg | ORAL_TABLET | Freq: Every day | ORAL | 0 refills | Status: DC

## 2021-07-06 NOTE — Progress Notes (Signed)
Sent in flexeril 

## 2021-07-20 ENCOUNTER — Encounter: Payer: Self-pay | Admitting: Family Medicine

## 2021-07-21 ENCOUNTER — Ambulatory Visit (INDEPENDENT_AMBULATORY_CARE_PROVIDER_SITE_OTHER): Payer: BC Managed Care – PPO | Admitting: Family Medicine

## 2021-07-21 ENCOUNTER — Encounter: Payer: Self-pay | Admitting: Family Medicine

## 2021-07-21 VITALS — BP 146/94 | HR 88 | Ht 68.0 in | Wt 238.0 lb

## 2021-07-21 DIAGNOSIS — I10 Essential (primary) hypertension: Secondary | ICD-10-CM

## 2021-07-21 DIAGNOSIS — B356 Tinea cruris: Secondary | ICD-10-CM

## 2021-07-21 DIAGNOSIS — N342 Other urethritis: Secondary | ICD-10-CM

## 2021-07-21 LAB — POCT URINALYSIS DIPSTICK
Bilirubin, UA: NEGATIVE
Blood, UA: NEGATIVE
Glucose, UA: NEGATIVE
Ketones, UA: NEGATIVE
Leukocytes, UA: NEGATIVE
Nitrite, UA: NEGATIVE
Protein, UA: NEGATIVE
Spec Grav, UA: 1.01 (ref 1.010–1.025)
Urobilinogen, UA: 0.2 E.U./dL
pH, UA: 5 (ref 5.0–8.0)

## 2021-07-21 MED ORDER — LOSARTAN POTASSIUM 50 MG PO TABS: 50.0000 mg | ORAL_TABLET | Freq: Every day | ORAL | 1 refills | Status: DC

## 2021-07-21 MED ORDER — DOXYCYCLINE HYCLATE 100 MG PO TABS: 100.0000 mg | ORAL_TABLET | Freq: Two times a day (BID) | ORAL | 1 refills | Status: DC

## 2021-07-21 MED ORDER — FLUCONAZOLE 150 MG PO TABS: 150.0000 mg | ORAL_TABLET | Freq: Once | ORAL | 1 refills | Status: DC

## 2021-07-21 MED ORDER — CLOTRIMAZOLE-BETAMETHASONE 1-0.05 % EX CREA: 1.0000 "application " | TOPICAL_CREAM | Freq: Every day | CUTANEOUS | 0 refills | Status: DC

## 2021-07-21 NOTE — Progress Notes (Signed)
? ? ?Date:  07/21/2021  ? ?Name:  Johnny Liu   DOB:  1965-10-21   MRN:  [id number] ? ? ?Chief Complaint: Hypertension and Penis Pain (Mild irritation at tip of penis with burning. Discomfort "up in there") ? ?Hypertension ?This is a chronic problem. The current episode started in the past 7 days. The problem has been gradually improving since onset. The problem is uncontrolled. Pertinent negatives include no anxiety, blurred vision, chest pain, headaches, malaise/fatigue, neck pain, orthopnea, palpitations, peripheral edema, PND, shortness of breath or sweats. There are no associated agents to hypertension. There are no known risk factors for coronary artery disease. The current treatment provides no improvement. Compliance problems include medication side effects.  There is no history of angina, kidney disease, CAD/MI, CVA, heart failure, left ventricular hypertrophy, PVD or retinopathy. There is no history of chronic renal disease, a hypertension causing med or renovascular disease.  ?Penis Pain ?The patient's primary symptoms include penile pain. The patient's pertinent negatives include no genital lesions. This is a new problem. The current episode started in the past 7 days. The problem occurs 2 to 4 times per day. The problem has been waxing and waning. The pain is mild. Pertinent negatives include no abdominal pain, chest pain, chills, constipation, coughing, diarrhea, dysuria, fever, frequency, headaches, nausea, rash, shortness of breath, sore throat or urgency.  ? ?Lab Results  ?Component Value Date  ? NA 137 01/17/2021  ? K 4.8 01/17/2021  ? CO2 23 01/17/2021  ? GLUCOSE 118 (H) 01/17/2021  ? BUN 12 01/17/2021  ? CREATININE 1.04 01/17/2021  ? CALCIUM 9.1 01/17/2021  ? EGFR 85 01/17/2021  ? GFRNONAA 74 10/02/2019  ? ?Lab Results  ?Component Value Date  ? CHOL 146 01/17/2021  ? HDL 53 01/17/2021  ? [place] 84 01/17/2021  ? TRIG 37 01/17/2021  ? CHOLHDL 3.2 12/21/2015  ? ?Lab Results  ?Component Value Date   ? TSH 0.747 02/04/2020  ? ?Lab Results  ?Component Value Date  ? HGBA1C 5.6 01/17/2021  ? ?Lab Results  ?Component Value Date  ? WBC 6.8 01/17/2021  ? HGB 16.5 01/17/2021  ? HCT 48.0 01/17/2021  ? MCV 90 01/17/2021  ? PLT 213 01/17/2021  ? ?Lab Results  ?Component Value Date  ? ALT 39 10/02/2019  ? AST 33 10/02/2019  ? ALKPHOS 76 10/02/2019  ? BILITOT 0.2 10/02/2019  ? ?No results found for: 25OHVITD2, [place], VD25OH  ? ?Review of Systems  ?Constitutional:  Negative for chills, fever and malaise/fatigue.  ?HENT:  Negative for drooling, ear discharge, ear pain and sore throat.   ?Eyes:  Negative for blurred vision.  ?Respiratory:  Negative for cough, shortness of breath and wheezing.   ?Cardiovascular:  Negative for chest pain, palpitations, orthopnea, leg swelling and PND.  ?Gastrointestinal:  Negative for abdominal pain, blood in stool, constipation, diarrhea and nausea.  ?Endocrine: Negative for polydipsia.  ?Genitourinary:  Positive for penile pain. Negative for dysuria, frequency, hematuria and urgency.  ?Musculoskeletal:  Negative for back pain, myalgias and neck pain.  ?Skin:  Negative for rash.  ?Allergic/Immunologic: Negative for environmental allergies.  ?Neurological:  Negative for dizziness and headaches.  ?Hematological:  Does not bruise/bleed easily.  ?Psychiatric/Behavioral:  Negative for suicidal ideas. The patient is not nervous/anxious.   ? ?Patient Active Problem List  ? Diagnosis Date Noted  ? Heartburn   ? Problems with swallowing and mastication   ? Gastritis without bleeding   ? Stricture and stenosis of esophagus   ?  Abnormal feces   ? Benign neoplasm of cecum   ? Polyp of sigmoid colon   ? Chronic prostatitis 09/23/2012  ? Hypertrophy of prostate with urinary obstruction and other lower urinary tract symptoms (LUTS) 09/23/2012  ? 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 ? ?No Known Allergies ? ?Past Surgical History:  ?Procedure Laterality Date  ? APPENDECTOMY    ? COLONOSCOPY WITH PROPOFOL N/A 08/31/2016  ? Procedure: COLONOSCOPY WITH PROPOFOL;  Surgeon: Lucilla Lame, MD;  Location: [facility];  Service: Endoscopy;  Laterality: N/A;  ? ESOPHAGEAL DILATION  08/31/2016  ? Procedure: ESOPHAGEAL DILATION;  Surgeon: Lucilla Lame, MD;  Location: [facility];  Service: Endoscopy;;  ? ESOPHAGOGASTRODUODENOSCOPY (EGD) WITH PROPOFOL N/A 08/31/2016  ? Procedure: ESOPHAGOGASTRODUODENOSCOPY (EGD) WITH PROPOFOL;  Surgeon: Lucilla Lame, MD;  Location: [facility];  Service: Endoscopy;  Laterality: N/A;  ? ? ?Social History  ? ?Tobacco Use  ? Smoking status: Never  ? Smokeless tobacco: Never  ?Vaping Use  ? Vaping Use: Never used  ?Substance Use Topics  ? Alcohol use: Yes  ?  Alcohol/week: 7.0 standard drinks  ?  Types: 7 Cans of beer per week  ? Drug use: No  ? ? ? ?Medication list has been reviewed and updated. ? ?Current Meds  ?Medication Sig  ? cyclobenzaprine (FLEXERIL) 5 MG tablet Take 1 tablet (5 mg total) by mouth 3 (three) times daily as needed for muscle spasms.  ? lisinopril (ZESTRIL) 2.5 MG tablet Take 1 tablet (2.5 mg total) by mouth daily.  ? Multiple Vitamin (MULTIVITAMIN) tablet Take 1 tablet by mouth daily.  ? testosterone cypionate (DEPOTESTOSTERONE CYPIONATE) 200 MG/ML injection Inject into the muscle. urology  ? ? ? ?  07/21/2021  ? 10:09 AM 09/23/2020  ? 10:55 AM 03/18/2020  ?  9:52 AM 02/02/2020  ?  3:54 PM  ?GAD 7 : Generalized Anxiety Score  ?Nervous, Anxious, on Edge 0 0 0 0  ?Control/stop worrying 0 0 0 0  ?Worry too much - different things 0 0 0 0  ?Trouble relaxing 0 0 0 0  ?Restless 0 0 0 0  ?Easily annoyed or irritable 0 0 0 0  ?Afraid - awful might happen 0 0 0 0  ?Total GAD 7 Score 0 0 0 0  ?Anxiety Difficulty Not difficult at all     ? ? ? ?  07/21/2021  ? 10:09 AM  ?Depression screen PHQ 2/9  ?Decreased Interest 0   ?Down, Depressed, Hopeless 0  ?PHQ - 2 Score 0  ?Altered sleeping 0  ?Tired, decreased energy 0  ?Change in appetite 0  ?Feeling bad or failure about yourself  0  ?Trouble concentrating 0  ?Moving slowly or fidgety/restless 0  ?Suicidal thoughts 0  ?PHQ-9 Score 0  ?Difficult doing work/chores Not difficult at all  ? ? ?BP Readings from Last 3 Encounters:  ?07/21/21 140/80  ?01/14/21 134/84  ?09/23/20 130/80  ? ? ?Physical Exam ?Vitals and nursing note reviewed.  ?HENT:  ?   Head: Normocephalic.  ?   Right Ear: Tympanic membrane, ear canal and external ear normal.  ?   Left Ear: Tympanic membrane, ear canal and external ear normal.  ?   Nose: Nose normal. No congestion or rhinorrhea.  ?Eyes:  ?   General: No scleral icterus.    ?   Right eye: No  discharge.     ?   Left eye: No discharge.  ?   Conjunctiva/sclera: Conjunctivae normal.  ?   Pupils: Pupils are equal, round, and reactive to light.  ?Neck:  ?   Thyroid: No thyromegaly.  ?   Vascular: No JVD.  ?   Trachea: No tracheal deviation.  ?Cardiovascular:  ?   Rate and Rhythm: Normal rate and regular rhythm.  ?   Heart sounds: Normal heart sounds. No murmur heard. ?  No friction rub. No gallop.  ?Pulmonary:  ?   Effort: No respiratory distress.  ?   Breath sounds: Normal breath sounds. No wheezing, rhonchi or rales.  ?Abdominal:  ?   General: Bowel sounds are normal.  ?   Palpations: Abdomen is soft. There is no mass.  ?   Tenderness: There is no abdominal tenderness. There is no guarding or rebound.  ?Musculoskeletal:     ?   General: No tenderness. Normal range of motion.  ?   Cervical back: Normal range of motion and neck supple.  ?Lymphadenopathy:  ?   Cervical: No cervical adenopathy.  ?Skin: ?   General: Skin is warm.  ?   Findings: No rash.  ?Neurological:  ?   Mental Status: He is alert and oriented to person, place, and time.  ?   Cranial Nerves: No cranial nerve deficit.  ?   Deep Tendon Reflexes: Reflexes are normal and symmetric.  ? ? ?Wt Readings from  Last 3 Encounters:  ?07/21/21 238 lb (108 kg)  ?01/14/21 226 lb (102.5 kg)  ?09/23/20 225 lb (102.1 kg)  ? ? ?BP 140/80   Pulse 88   Ht _0  (1.727 m)   Wt 238 lb (108 kg)   BMI 36.19 kg/m?  ? ?Assessment and

## 2021-08-10 DIAGNOSIS — C44319 Basal cell carcinoma of skin of other parts of face: Secondary | ICD-10-CM | POA: Diagnosis not present

## 2021-08-10 DIAGNOSIS — D1801 Hemangioma of skin and subcutaneous tissue: Secondary | ICD-10-CM | POA: Diagnosis not present

## 2021-08-10 DIAGNOSIS — L821 Other seborrheic keratosis: Secondary | ICD-10-CM | POA: Diagnosis not present

## 2021-08-16 ENCOUNTER — Ambulatory Visit: Payer: Self-pay | Admitting: *Deleted

## 2021-08-16 NOTE — Telephone Encounter (Signed)
?  Chief Complaint: Positive covid ?Symptoms: Body aches, sinus pain, mild scratchy throat, headache "Feeling cloudy." LGT ?Frequency: Symptoms onset Sat. ?Pertinent Negatives: Patient denies Sob, wheezing ?Disposition: [] ED /[] Urgent Care (no appt availability in office) / [x] Appointment(In office/virtual)/ []  [facility]/ [] Home Care/ [] Refused Recommended Disposition /[] [facility]/ []  Follow-up with PCP ?Additional Notes: Pt requested oral anti viral. Virtual Appt secured by Estill Bamberg for tomorrow at 1020. Care advise provided. Pt declined review of self isolation guidelines, "My family has had covid 2-3 times around, we are aware of the protocols."  ?Reason for Disposition ? [1] COVID-19 diagnosed by positive lab test (e.g., PCR, rapid self-test kit) AND [2] mild symptoms (e.g., cough, fever, others) AND [5] no complications or SOB ? ?Answer Assessment - Initial Assessment Questions ?1. COVID-19 DIAGNOSIS: "Who made your COVID-19 diagnosis?" "Was it confirmed by a positive lab test or self-test?" If not diagnosed by a doctor (or NP/PA), ask "Are there lots of cases (community spread) where you live?" Note: See public health department website, if unsure. ?    Home tests ?2. COVID-19 EXPOSURE: "Was there any known exposure to COVID before the symptoms began?" CDC Definition of close contact: within 6 feet (2 meters) for a total of 15 minutes or more over a 24-hour period.  ?    No ?3. ONSET: "When did the COVID-19 symptoms start?"  ?    Saturday afternoon ?4. WORST SYMPTOM: "What is your worst symptom?" (e.g., cough, fever, shortness of breath, muscle aches) ?     ?5. COUGH: "Do you have a cough?" If Yes, ask: "How bad is the cough?"   ?    Dry cough ?6. FEVER: "Do you have a fever?" If Yes, ask: "What is your temperature, how was it measured, and when did it start?" ?    LGT ?7. RESPIRATORY STATUS: "Describe your breathing?" (e.g., shortness of breath, wheezing, unable to speak)  ?    No ?8.  BETTER-SAME-WORSE: "Are you getting better, staying the same or getting worse compared to yesterday?"  If getting worse, ask, "In what way?" ?    Worse ?9. HIGH RISK DISEASE: "Do you have any chronic medical problems?" (e.g., asthma, heart or lung disease, weak immune system, obesity, etc.) ?    *No Answer* ?10. VACCINE: "Have you had the COVID-19 vaccine?" If Yes, ask: "Which one, how many shots, when did you get it?" ?      Yes, pfizer ?11. BOOSTER: "Have you received your COVID-19 booster?" If Yes, ask: "Which one and when did you get it?" ?      no ? ?13. OTHER SYMPTOMS: "Do you have any other symptoms?"  (e.g., chills, fatigue, headache, loss of smell or taste, muscle pain, sore throat) ?      Headache, sinus discomfort, body aches, "Feeling cloudy." ?14. O2 SATURATION MONITOR:  "Do you use an oxygen saturation monitor (pulse oximeter) at home?" If Yes, ask "What is your reading (oxygen level) today?" "What is your usual oxygen saturation reading?" (e.g., 95%) ?      No ? ?Protocols used: Coronavirus ([id number]-19) Diagnosed or Suspected-A-AH ? ?

## 2021-08-16 NOTE — Telephone Encounter (Signed)
The patient has tested positive for COVID 19 yesterday 08/15/21 via an at home test  ? ?The patient is experiencing sore throat, congestion, and body aches  ? ?The patient would like to be prescribed something for their discomfort  ? ?Please contact further when available  ?

## 2021-08-17 ENCOUNTER — Telehealth: Payer: BC Managed Care – PPO | Admitting: Family Medicine

## 2021-09-22 DIAGNOSIS — C44319 Basal cell carcinoma of skin of other parts of face: Secondary | ICD-10-CM | POA: Diagnosis not present

## 2021-09-28 ENCOUNTER — Other Ambulatory Visit: Payer: Self-pay | Admitting: Family Medicine

## 2021-09-28 DIAGNOSIS — N289 Disorder of kidney and ureter, unspecified: Secondary | ICD-10-CM

## 2021-09-28 IMAGING — CR DG WRIST COMPLETE 3+V*L*
4 series · 4 of 4 positions shown · non-contrast
Comparison: None.

CLINICAL DATA: Left wrist pain starting yesterday.

EXAM:
LEFT WRIST - COMPLETE 3+ VIEW

[wrist pa]
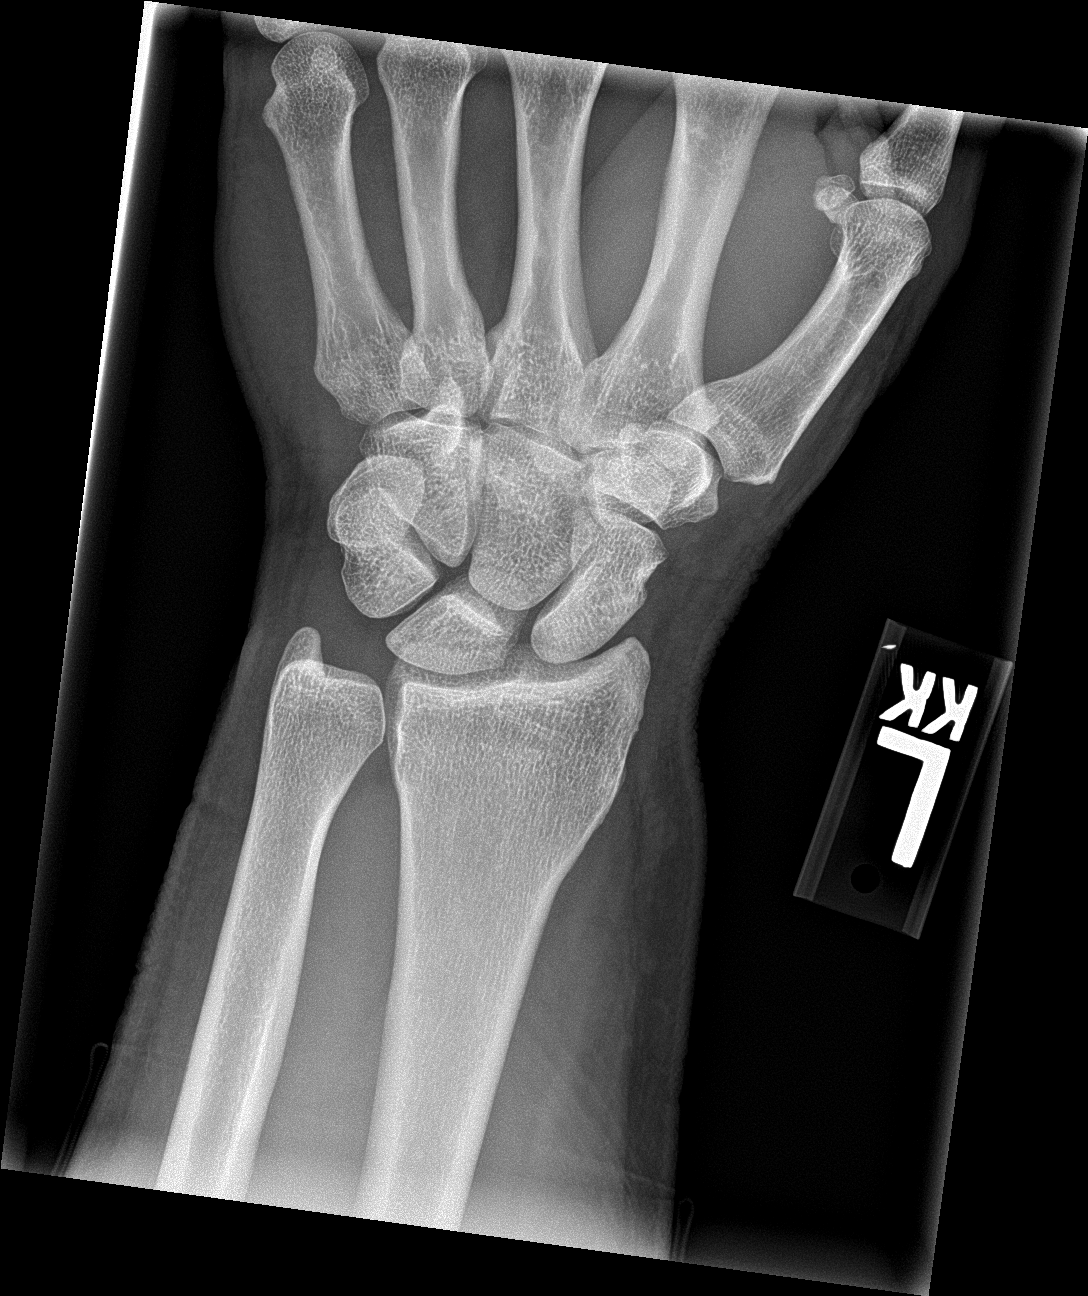

[wrist obl]
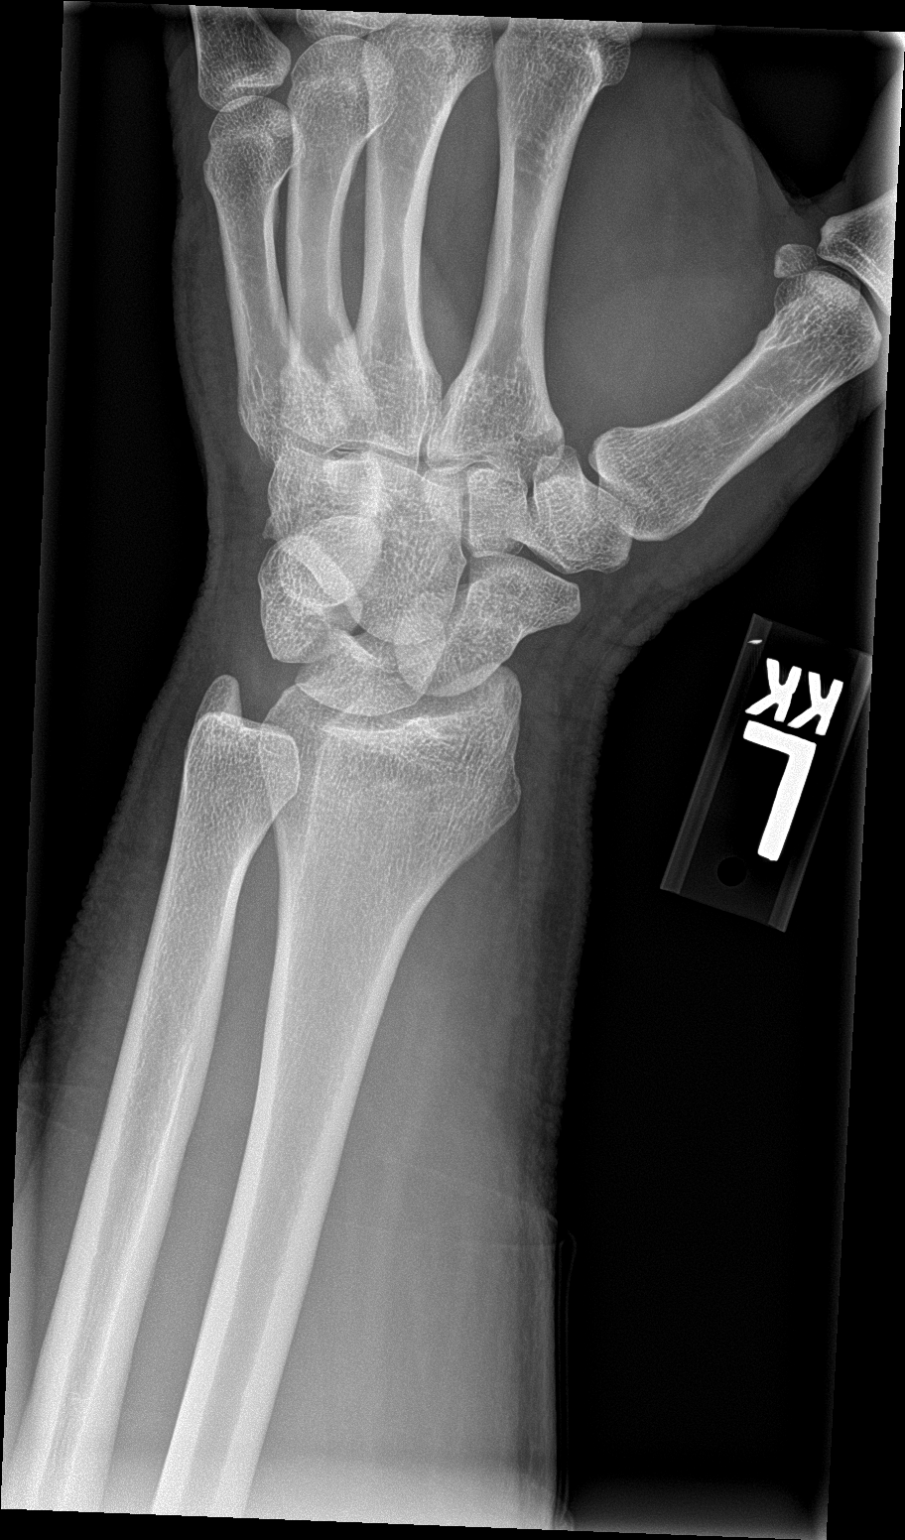

[wrist lat]
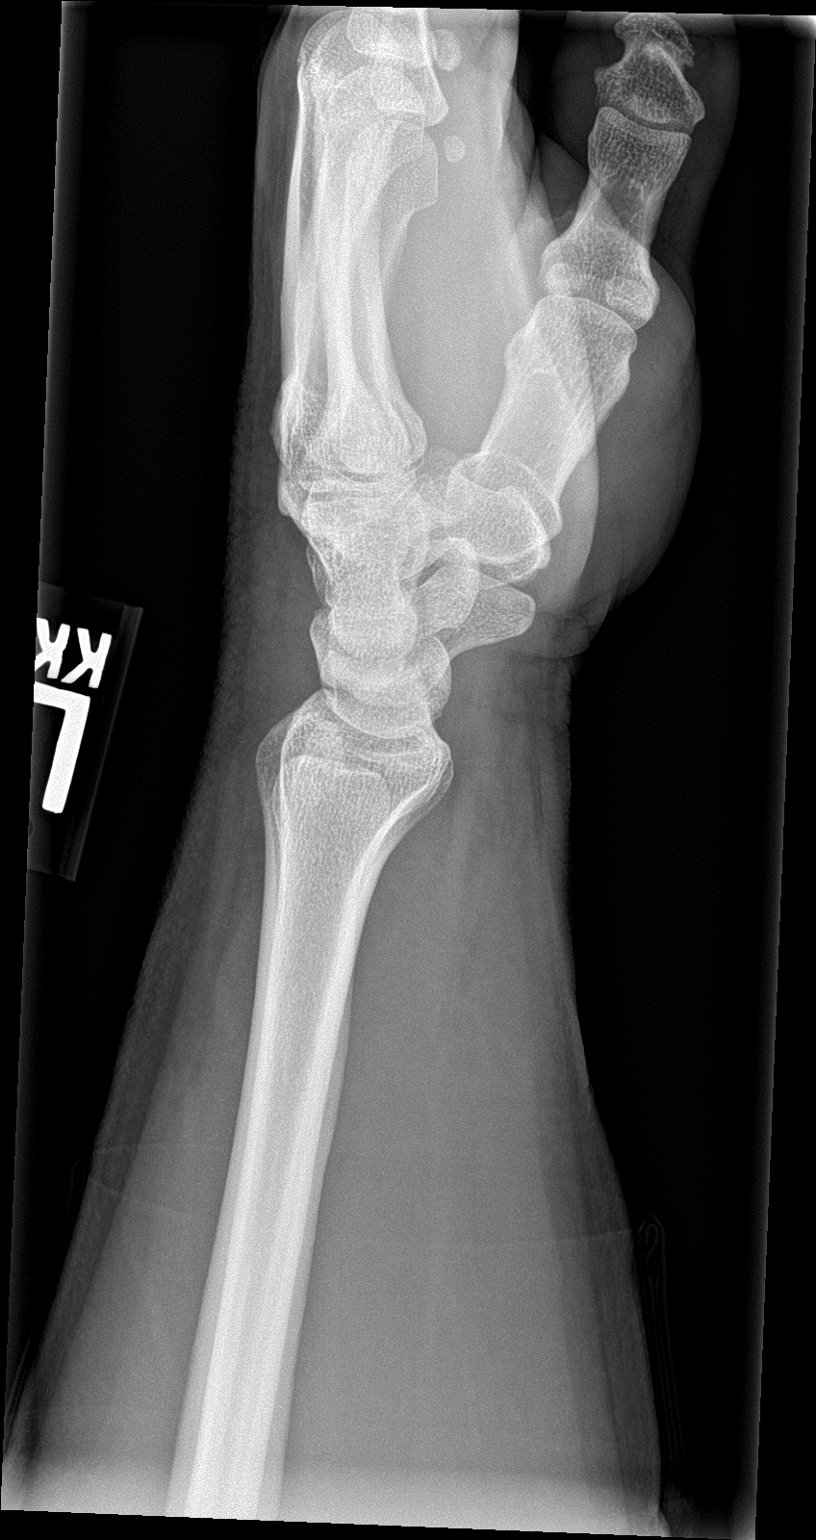

[wrist navicular]
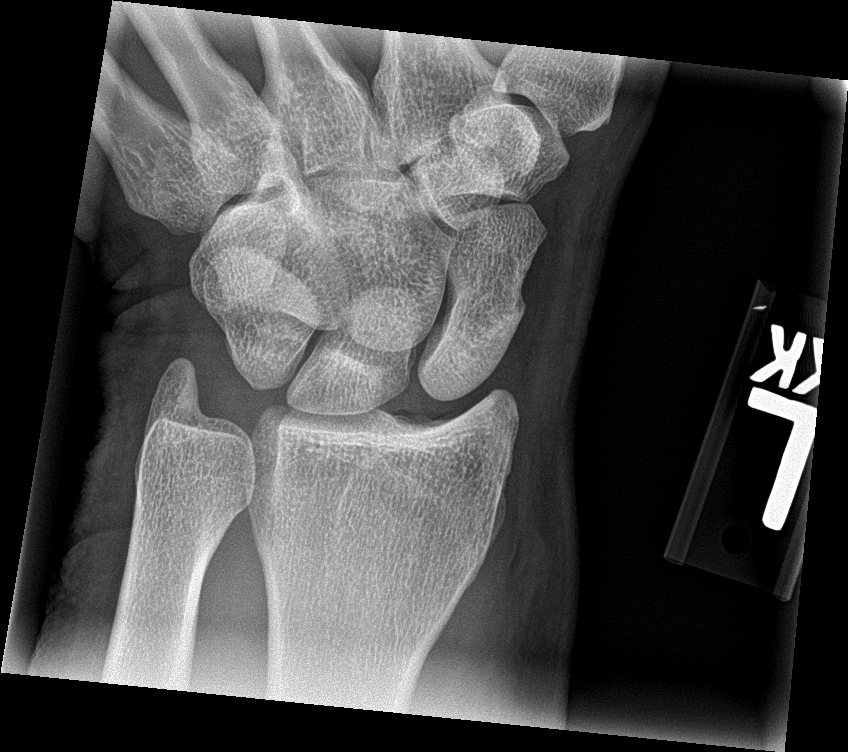

[4 of 4 positions shown; findings below may reference images not displayed]

FINDINGS: There is no evidence of fracture or dislocation. There is no
evidence of arthropathy or other focal bone abnormality. Soft
tissues are unremarkable.
IMPRESSION: Negative.

## 2021-09-28 NOTE — Telephone Encounter (Signed)
rx was changed to a different medication Requested Prescriptions  Pending Prescriptions Disp Refills  . lisinopril (ZESTRIL) 2.5 MG tablet [Pharmacy Med Name: LISINOPRIL 2.5 MG TAB] 30 tablet 0    Sig: TAKE (1) TABLET BY MOUTH EVERY DAY     Cardiovascular:  ACE Inhibitors Failed - 09/28/2021 10:08 AM      Failed - Cr in normal range and within 180 days    Creatinine, Ser  Date Value Ref Range Status  01/17/2021 1.04 0.76 - 1.27 mg/dL Final         Failed - K in normal range and within 180 days    Potassium  Date Value Ref Range Status  01/17/2021 4.8 3.5 - 5.2 mmol/L Final         Failed - Last BP in normal range    BP Readings from Last 1 Encounters:  07/21/21 (!) 146/94         Passed - Patient is not pregnant      Passed - Valid encounter within last 6 months    Recent Outpatient Visits          2 months ago Urethritis   [place], MD   8 months ago Annual physical exam   [place], Deanna C, MD   1 year ago Attention disturbance   [facility] Juline Patch, MD   1 year ago Depression, unspecified depression type   [facility] Juline Patch, MD   1 year ago Fatigue, unspecified type   [facility] Juline Patch, MD      Future Appointments            In 3 months Juline Patch, MD [facility], [facility]

## 2021-11-17 ENCOUNTER — Other Ambulatory Visit: Payer: Self-pay | Admitting: Family Medicine

## 2021-11-17 DIAGNOSIS — M545 Low back pain, unspecified: Secondary | ICD-10-CM

## 2021-12-27 ENCOUNTER — Other Ambulatory Visit: Payer: Self-pay | Admitting: Family Medicine

## 2021-12-27 DIAGNOSIS — I10 Essential (primary) hypertension: Secondary | ICD-10-CM

## 2022-01-20 ENCOUNTER — Ambulatory Visit: Payer: BC Managed Care – PPO | Admitting: Family Medicine

## 2022-01-30 ENCOUNTER — Ambulatory Visit: Payer: BC Managed Care – PPO | Admitting: Family Medicine

## 2022-03-04 ENCOUNTER — Other Ambulatory Visit: Payer: Self-pay | Admitting: Family Medicine

## 2022-03-04 DIAGNOSIS — I10 Essential (primary) hypertension: Secondary | ICD-10-CM

## 2022-03-06 NOTE — Telephone Encounter (Signed)
Requested medication (s) are due for refill today: routing for review  Requested medication (s) are on the active medication list:yes  Last refill:  12/27/21  Future visit scheduled: yes  Notes to clinic:  Unable to refill per protocol, courtesy refill already given, routing for provider approval.      Requested Prescriptions  Pending Prescriptions Disp Refills   losartan (COZAAR) 50 MG tablet [Pharmacy Med Name: LOSARTAN POTASSIUM 50 MG TAB] 30 tablet 0    Sig: TAKE (1) TABLET BY MOUTH EVERY DAY     Cardiovascular:  Angiotensin Receptor Blockers Failed - 03/04/2022  8:41 AM      Failed - Cr in normal range and within 180 days    Creatinine, Ser  Date Value Ref Range Status  01/17/2021 1.04 0.76 - 1.27 mg/dL Final         Failed - K in normal range and within 180 days    Potassium  Date Value Ref Range Status  01/17/2021 4.8 3.5 - 5.2 mmol/L Final         Failed - Last BP in normal range    BP Readings from Last 1 Encounters:  07/21/21 (!) 146/94         Failed - Valid encounter within last 6 months    Recent Outpatient Visits           7 months ago Urethritis   [facility] [place], Deanna C, MD   1 year ago Annual physical exam   [facility] [place], Deanna C, MD   1 year ago Attention disturbance   [facility] [place], Deanna C, MD   1 year ago Depression, unspecified depression type   [facility] [place], Deanna C, MD   2 years ago Fatigue, unspecified type   [facility] [place], [place], MD       Future Appointments             In 4 months Juline Patch, MD [facility] at [facility] [place] - Patient is not pregnant

## 2022-05-26 DIAGNOSIS — N529 Male erectile dysfunction, unspecified: Secondary | ICD-10-CM | POA: Diagnosis not present

## 2022-05-26 DIAGNOSIS — E291 Testicular hypofunction: Secondary | ICD-10-CM | POA: Diagnosis not present

## 2022-05-31 DIAGNOSIS — R7989 Other specified abnormal findings of blood chemistry: Secondary | ICD-10-CM | POA: Diagnosis not present

## 2022-05-31 DIAGNOSIS — R739 Hyperglycemia, unspecified: Secondary | ICD-10-CM | POA: Diagnosis not present

## 2022-05-31 DIAGNOSIS — R635 Abnormal weight gain: Secondary | ICD-10-CM | POA: Diagnosis not present

## 2022-05-31 DIAGNOSIS — I10 Essential (primary) hypertension: Secondary | ICD-10-CM | POA: Diagnosis not present

## 2022-05-31 DIAGNOSIS — R5383 Other fatigue: Secondary | ICD-10-CM | POA: Diagnosis not present

## 2022-05-31 DIAGNOSIS — N529 Male erectile dysfunction, unspecified: Secondary | ICD-10-CM | POA: Diagnosis not present

## 2022-05-31 LAB — IRON,TIBC AND FERRITIN PANEL
%SAT: 40
Ferritin: 635
Iron: 129
TIBC: 324
UIBC: 195

## 2022-05-31 LAB — HEPATIC FUNCTION PANEL
ALT: 46 U/L — AB (ref 10–40)
AST: 35 (ref 14–40)
Alkaline Phosphatase: 53 (ref 25–125)
Bilirubin, Total: 0.6

## 2022-05-31 LAB — BASIC METABOLIC PANEL
BUN: 18 (ref 4–21)
CO2: 20 (ref 13–22)
Chloride: 101 (ref 99–108)
Creatinine: 1.2 (ref 0.6–1.3)
Glucose: 115
Potassium: 4.9 mEq/L (ref 3.5–5.1)
Sodium: 137 (ref 137–147)

## 2022-05-31 LAB — COMPREHENSIVE METABOLIC PANEL
Albumin: 5 (ref 3.5–5.0)
Calcium: 8.8 (ref 8.7–10.7)
Globulin: 2.6
eGFR: 73

## 2022-05-31 LAB — LIPID PANEL
Cholesterol: 144 (ref 0–200)
Triglycerides: 57 (ref 40–160)

## 2022-05-31 LAB — CBC: RBC: 5.63 — AB (ref 3.87–5.11)

## 2022-05-31 LAB — TESTOSTERONE: Testosterone: 137

## 2022-05-31 LAB — CBC AND DIFFERENTIAL
HCT: 51 (ref 41–53)
Hemoglobin: 17.4 (ref 13.5–17.5)
Neutrophils Absolute: 57
Platelets: 206 10*3/uL (ref 150–400)
WBC: 6.2

## 2022-05-31 LAB — TSH: TSH: 1.16 (ref 0.41–5.90)

## 2022-05-31 LAB — POCT ERYTHROCYTE SEDIMENTATION RATE, NON-AUTOMATED: Sed Rate: 9

## 2022-05-31 LAB — VITAMIN D 25 HYDROXY (VIT D DEFICIENCY, FRACTURES): Vit D, 25-Hydroxy: 31.1

## 2022-06-23 ENCOUNTER — Ambulatory Visit: Payer: BC Managed Care – PPO | Admitting: Family Medicine

## 2022-06-23 ENCOUNTER — Encounter: Payer: Self-pay | Admitting: Family Medicine

## 2022-06-23 VITALS — BP 124/78 | HR 88 | Ht 68.0 in | Wt 238.0 lb

## 2022-06-23 DIAGNOSIS — M545 Low back pain, unspecified: Secondary | ICD-10-CM | POA: Diagnosis not present

## 2022-06-23 DIAGNOSIS — N529 Male erectile dysfunction, unspecified: Secondary | ICD-10-CM | POA: Diagnosis not present

## 2022-06-23 DIAGNOSIS — I10 Essential (primary) hypertension: Secondary | ICD-10-CM | POA: Diagnosis not present

## 2022-06-23 MED ORDER — LOSARTAN POTASSIUM 50 MG PO TABS: ORAL_TABLET | ORAL | 5 refills | Status: DC

## 2022-06-23 MED ORDER — CYCLOBENZAPRINE HCL 5 MG PO TABS: ORAL_TABLET | ORAL | 11 refills | Status: DC

## 2022-06-23 MED ORDER — CYCLOBENZAPRINE HCL 5 MG PO TABS: ORAL_TABLET | ORAL | 0 refills | Status: DC

## 2022-06-23 MED ORDER — SILDENAFIL CITRATE 50 MG PO TABS: 50.0000 mg | ORAL_TABLET | Freq: Every day | ORAL | 11 refills | Status: AC | PRN

## 2022-06-23 NOTE — Progress Notes (Signed)
Date:  06/23/2022   Name:  Johnny Liu   DOB:  September 21, 1965   MRN:  [id number]   Chief Complaint: Medication Refill  Hypertension This is a chronic problem. The current episode started more than 1 year ago. The problem has been gradually improving since onset. The problem is controlled. Pertinent negatives include no anxiety, blurred vision, chest pain, headaches, malaise/fatigue, neck pain, orthopnea, palpitations, peripheral edema, PND, shortness of breath or sweats. There are no associated agents to hypertension. Risk factors for coronary artery disease include dyslipidemia. Past treatments include angiotensin blockers. The current treatment provides moderate improvement. There are no compliance problems.  There is no history of angina, kidney disease, CAD/MI, CVA, heart failure, left ventricular hypertrophy, PVD or retinopathy. There is no history of chronic renal disease, a hypertension causing med or renovascular disease.  Back Pain This is a chronic problem. The current episode started more than 1 year ago. The problem has been gradually improving since onset. The pain is present in the lumbar spine. Pertinent negatives include no abdominal pain, chest pain or headaches.    Lab Results  Component Value Date   NA 137 01/17/2021   K 4.8 01/17/2021   CO2 23 01/17/2021   GLUCOSE 118 (H) 01/17/2021   BUN 12 01/17/2021   CREATININE 1.04 01/17/2021   CALCIUM 9.1 01/17/2021   EGFR 85 01/17/2021   GFRNONAA 74 10/02/2019   Lab Results  Component Value Date   CHOL 146 01/17/2021   HDL 53 01/17/2021   LDLCALC 84 01/17/2021   TRIG 37 01/17/2021   CHOLHDL 3.2 12/21/2015   Lab Results  Component Value Date   TSH 0.747 02/04/2020   Lab Results  Component Value Date   HGBA1C 5.6 01/17/2021   Lab Results  Component Value Date   WBC 6.8 01/17/2021   HGB 16.5 01/17/2021   HCT 48.0 01/17/2021   MCV 90 01/17/2021   PLT 213 01/17/2021   Lab Results  Component Value Date   ALT 39  10/02/2019   AST 33 10/02/2019   ALKPHOS 76 10/02/2019   BILITOT 0.2 10/02/2019   No results found for: "25OHVITD2", "25OHVITD3", "VD25OH"   Review of Systems  Constitutional:  Negative for malaise/fatigue.  Eyes:  Negative for blurred vision.  Respiratory:  Negative for cough, shortness of breath and wheezing.   Cardiovascular:  Negative for chest pain, palpitations, orthopnea, leg swelling and PND.  Gastrointestinal:  Negative for abdominal pain, blood in stool and constipation.  Endocrine: Negative for polydipsia and polyuria.  Genitourinary:  Negative for difficulty urinating.  Musculoskeletal:  Positive for back pain. Negative for neck pain.  Neurological:  Negative for headaches.    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   Other specified disorder of male genital organs(608.89) 09/23/2012    No Known Allergies  Past Surgical History:  Procedure Laterality Date   APPENDECTOMY     COLONOSCOPY WITH PROPOFOL N/A 08/31/2016   Procedure: COLONOSCOPY WITH PROPOFOL;  Surgeon: Lucilla Lame,  MD;  Location: [facility];  Service: Endoscopy;  Laterality: N/A;   ESOPHAGEAL DILATION  08/31/2016   Procedure: ESOPHAGEAL DILATION;  Surgeon: Lucilla Lame, MD;  Location: [facility];  Service: Endoscopy;;   ESOPHAGOGASTRODUODENOSCOPY (EGD) WITH PROPOFOL N/A 08/31/2016   Procedure: ESOPHAGOGASTRODUODENOSCOPY (EGD) WITH PROPOFOL;  Surgeon: Lucilla Lame, MD;  Location: [facility];  Service: Endoscopy;   Laterality: N/A;    Social History   Tobacco Use   Smoking status: Never   Smokeless tobacco: Never  Vaping Use   Vaping Use: Never used  Substance Use Topics   Alcohol use: Yes    Alcohol/week: 7.0 standard drinks of alcohol    Types: 7 Cans of beer per week   Drug use: No     Medication list has been reviewed and updated.  Current Meds  Medication Sig   clotrimazole-betamethasone (LOTRISONE) cream Apply 1 application. topically daily.   cyclobenzaprine (FLEXERIL) 5 MG tablet TAKE (1) TABLET BY MOUTH THREE TIMES A DAY AS NEEDED FOR MUSCLE SPASMS   losartan (COZAAR) 50 MG tablet TAKE (1) TABLET BY MOUTH EVERY DAY   Multiple Vitamin (MULTIVITAMIN) tablet Take 1 tablet by mouth daily.   testosterone cypionate (DEPOTESTOSTERONE CYPIONATE) 200 MG/ML injection Inject into the muscle. urology       07/21/2021   10:09 AM 09/23/2020   10:55 AM 03/18/2020    9:52 AM 02/02/2020    3:54 PM  GAD 7 : Generalized Anxiety Score  Nervous, Anxious, on Edge 0 0 0 0  Control/stop worrying 0 0 0 0  Worry too much - different things 0 0 0 0  Trouble relaxing 0 0 0 0  Restless 0 0 0 0  Easily annoyed or irritable 0 0 0 0  Afraid - awful might happen 0 0 0 0  Total GAD 7 Score 0 0 0 0  Anxiety Difficulty Not difficult at all          07/21/2021   10:09 AM 09/23/2020   10:54 AM 03/18/2020    9:51 AM  Depression screen PHQ 2/9  Decreased Interest 0 0 0  Down, Depressed, Hopeless 0 0 0  PHQ - 2 Score 0 0 0  Altered sleeping 0 0 0  Tired, decreased energy 0 2 0  Change in appetite 0 0 0  Feeling bad or failure about yourself  0 0 0  Trouble concentrating 0 1 0  Moving slowly or fidgety/restless 0 0 0  Suicidal thoughts 0 0 0  PHQ-9 Score 0 3 0  Difficult doing work/chores Not difficult at all Not difficult at all     BP Readings from Last 3 Encounters:  06/23/22 124/78  07/21/21 (!) 146/94  01/14/21 134/84    Physical Exam Vitals and nursing note reviewed.  HENT:     Head:  Normocephalic.     Right Ear: Tympanic membrane, ear canal and external ear normal.     Left Ear: Tympanic membrane, ear canal and external ear normal.     Nose: Nose normal. No congestion or rhinorrhea.     Mouth/Throat:     Mouth: Mucous membranes are moist.  Eyes:     General: No scleral icterus.       Right eye: No discharge.        Left eye: No discharge.     Conjunctiva/sclera: Conjunctivae normal.     Pupils: Pupils are equal, round, and reactive to light.  Neck:     Thyroid: No thyromegaly.  Vascular: No JVD.     Trachea: No tracheal deviation.  Cardiovascular:     Rate and Rhythm: Normal rate and regular rhythm.     Heart sounds: Normal heart sounds. No murmur heard.    No friction rub. No gallop.  Pulmonary:     Effort: No respiratory distress.     Breath sounds: Normal breath sounds. No wheezing, rhonchi or rales.  Abdominal:     General: Bowel sounds are normal.     Palpations: Abdomen is soft. There is no mass.     Tenderness: There is no abdominal tenderness. There is no guarding or rebound.  Musculoskeletal:        General: No tenderness. Normal range of motion.     Cervical back: Normal range of motion and neck supple.  Lymphadenopathy:     Cervical: No cervical adenopathy.  Skin:    General: Skin is warm.     Findings: No rash.  Neurological:     Mental Status: He is alert.     Wt Readings from Last 3 Encounters:  07/21/21 238 lb (108 kg)  01/14/21 226 lb (102.5 kg)  09/23/20 225 lb (102.1 kg)    BP 124/78   Pulse 88   Ht 5\' 8"  (1.727 m)   SpO2 98%   BMI 36.19 kg/m   Assessment and Plan:  1. Primary hypertension Chronic.  Controlled.  Stable.  Blood pressure 124/78.  Asymptomatic.  Tolerating medication well.  Discussed new guidelines and that patient still has elevated blood pressure whether he is taking his medicine or not.  We will resume his losartan 50 mg once a day and patient will return in 6 months and will recheck on an as-needed  basis. - losartan (COZAAR) 50 MG tablet; TAKE (1) TABLET BY MOUTH EVERY DAY  Dispense: 30 tablet; Refill: 5  2. Acute low back pain, unspecified back pain laterality, unspecified whether sciatica present Chronic.  Controlled.  Stable.  Patient has lower back pain pending his workouts in the gym and daily activities such as gardening work.  We will refill his Flexeril primarily to be taken at night on a as needed basis. - cyclobenzaprine (FLEXERIL) 5 MG tablet; TAKE (1) TABLET BY MOUTH THREE TIMES A DAY AS NEEDED FOR MUSCLE SPASMS  Dispense: 30 tablet; Refill: 0  3. 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  Episodic.  Stable.  Patient would like generic Viagra called in and we will proceed with 50 mg on a as needed basis. - sildenafil (VIAGRA) 50 MG tablet; Take 1 tablet (50 mg total) by mouth daily as needed for erectile dysfunction.  Dispense: 10 tablet; Refill: 11    Otilio Miu, MD

## 2022-08-01 ENCOUNTER — Ambulatory Visit: Payer: BC Managed Care – PPO | Admitting: Family Medicine

## 2022-08-02 DIAGNOSIS — Z79890 Hormone replacement therapy (postmenopausal): Secondary | ICD-10-CM | POA: Diagnosis not present

## 2022-08-02 DIAGNOSIS — R7989 Other specified abnormal findings of blood chemistry: Secondary | ICD-10-CM | POA: Diagnosis not present

## 2022-10-26 DIAGNOSIS — M5412 Radiculopathy, cervical region: Secondary | ICD-10-CM | POA: Diagnosis not present

## 2022-10-30 DIAGNOSIS — M531 Cervicobrachial syndrome: Secondary | ICD-10-CM | POA: Diagnosis not present

## 2022-10-30 DIAGNOSIS — M9902 Segmental and somatic dysfunction of thoracic region: Secondary | ICD-10-CM | POA: Diagnosis not present

## 2022-10-30 DIAGNOSIS — M546 Pain in thoracic spine: Secondary | ICD-10-CM | POA: Diagnosis not present

## 2022-10-30 DIAGNOSIS — M9901 Segmental and somatic dysfunction of cervical region: Secondary | ICD-10-CM | POA: Diagnosis not present

## 2022-11-01 DIAGNOSIS — M531 Cervicobrachial syndrome: Secondary | ICD-10-CM | POA: Diagnosis not present

## 2022-11-01 DIAGNOSIS — M9901 Segmental and somatic dysfunction of cervical region: Secondary | ICD-10-CM | POA: Diagnosis not present

## 2022-11-01 DIAGNOSIS — M9902 Segmental and somatic dysfunction of thoracic region: Secondary | ICD-10-CM | POA: Diagnosis not present

## 2022-11-01 DIAGNOSIS — M546 Pain in thoracic spine: Secondary | ICD-10-CM | POA: Diagnosis not present

## 2022-11-06 DIAGNOSIS — M5412 Radiculopathy, cervical region: Secondary | ICD-10-CM | POA: Diagnosis not present

## 2022-11-07 DIAGNOSIS — M5412 Radiculopathy, cervical region: Secondary | ICD-10-CM | POA: Diagnosis not present

## 2022-11-08 DIAGNOSIS — M9901 Segmental and somatic dysfunction of cervical region: Secondary | ICD-10-CM | POA: Diagnosis not present

## 2022-11-08 DIAGNOSIS — M531 Cervicobrachial syndrome: Secondary | ICD-10-CM | POA: Diagnosis not present

## 2022-11-08 DIAGNOSIS — M9902 Segmental and somatic dysfunction of thoracic region: Secondary | ICD-10-CM | POA: Diagnosis not present

## 2022-11-08 DIAGNOSIS — M546 Pain in thoracic spine: Secondary | ICD-10-CM | POA: Diagnosis not present
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
## 2022-11-15 ENCOUNTER — Ambulatory Visit
Admission: RE | Admit: 2022-11-15 | Discharge: 2022-11-15 | Disposition: A | Discharge status: Home / Self Care | Payer: BC Managed Care – PPO | Source: Ambulatory Visit | Attending: Neurosurgery | Admitting: Neurosurgery

## 2022-11-15 ENCOUNTER — Encounter: Payer: Self-pay | Admitting: Neurosurgery

## 2022-11-15 ENCOUNTER — Ambulatory Visit
Admission: RE | Admit: 2022-11-15 | Discharge: 2022-11-15 | Disposition: A | Discharge status: Home / Self Care | Payer: BC Managed Care – PPO | Attending: Neurosurgery | Admitting: Neurosurgery

## 2022-11-15 ENCOUNTER — Ambulatory Visit: Payer: BC Managed Care – PPO | Admitting: Neurosurgery

## 2022-11-15 VITALS — BP 122/82 | Ht 68.0 in | Wt 224.6 lb

## 2022-11-15 DIAGNOSIS — M4722 Other spondylosis with radiculopathy, cervical region: Secondary | ICD-10-CM

## 2022-11-15 DIAGNOSIS — M542 Cervicalgia: Secondary | ICD-10-CM

## 2022-11-15 DIAGNOSIS — M5412 Radiculopathy, cervical region: Secondary | ICD-10-CM

## 2022-11-22 ENCOUNTER — Ambulatory Visit
Admission: RE | Admit: 2022-11-22 | Discharge: 2022-11-22 | Disposition: A | Discharge status: Home / Self Care | Payer: BC Managed Care – PPO | Source: Ambulatory Visit | Attending: Neurosurgery | Admitting: Neurosurgery

## 2022-11-22 DIAGNOSIS — M50222 Other cervical disc displacement at C5-C6 level: Secondary | ICD-10-CM | POA: Diagnosis not present

## 2022-11-22 DIAGNOSIS — M542 Cervicalgia: Secondary | ICD-10-CM | POA: Insufficient documentation

## 2022-11-22 DIAGNOSIS — M47812 Spondylosis without myelopathy or radiculopathy, cervical region: Secondary | ICD-10-CM | POA: Diagnosis not present

## 2022-11-22 DIAGNOSIS — M4802 Spinal stenosis, cervical region: Secondary | ICD-10-CM | POA: Diagnosis not present

## 2022-11-22 DIAGNOSIS — R200 Anesthesia of skin: Secondary | ICD-10-CM | POA: Diagnosis not present

## 2022-11-28 ENCOUNTER — Ambulatory Visit (INDEPENDENT_AMBULATORY_CARE_PROVIDER_SITE_OTHER): Payer: BC Managed Care – PPO | Admitting: Neurosurgery

## 2022-11-28 DIAGNOSIS — M5000 Cervical disc disorder with myelopathy, unspecified cervical region: Secondary | ICD-10-CM

## 2022-11-28 DIAGNOSIS — M5412 Radiculopathy, cervical region: Secondary | ICD-10-CM

## 2022-11-29 DIAGNOSIS — M5000 Cervical disc disorder with myelopathy, unspecified cervical region: Secondary | ICD-10-CM | POA: Insufficient documentation

## 2022-11-29 NOTE — Progress Notes (Signed)
I had a follow-up phone visit today with [name].  We are discussing his MRI which she had done and we are just waiting on the final results.  He was at home and I was in the office.  He gave consent to go forward with a phone conversation.  As our concern was he does have a cervical disc herniation at C5-6 on the left which is causing his radiculopathy with weakness.  Given his weakness he would be a candidate for a cervical discectomy with a posterior approach.  Will plan on waiting for the final read of the MR, however once this is done we would like to move forward with surgical planning.  Will contact him once the imaging is done to make a time for surgical decompression.

## 2022-12-06 ENCOUNTER — Telehealth: Payer: Self-pay

## 2022-12-06 ENCOUNTER — Other Ambulatory Visit: Payer: Self-pay

## 2022-12-06 DIAGNOSIS — Z01818 Encounter for other preprocedural examination: Secondary | ICD-10-CM

## 2022-12-06 DIAGNOSIS — M5412 Radiculopathy, cervical region: Secondary | ICD-10-CM

## 2022-12-06 DIAGNOSIS — M5000 Cervical disc disorder with myelopathy, unspecified cervical region: Secondary | ICD-10-CM

## 2022-12-06 NOTE — Telephone Encounter (Signed)
Planned surgery: C5-6 posterior cervical discectomy   Surgery date: 12/21/22 at [facility]: [address], [place], [place] [address]) - you will find out your arrival time the business day before your surgery.   Semaglutide injection: hold 7 days prior to surgery   Pre-op appointment at [facility] Pre-admit Testing: we will call you with a date/time for this. If you are scheduled for an in person appointment, Pre-admit Testing is located on the first floor of the [facility], [address], [address]. Please bring all prescriptions in the original prescription bottles to your appointment. During this appointment, they will advise you which medications you can take the morning of surgery, and which medications you will need to hold for surgery. Labs (such as blood work, EKG) may be done at your pre-op appointment. You are not required to fast for these labs. Should you need to change your pre-op appointment, please call Pre-admit testing at [phone].      Common restrictions after surgery: No bending, lifting, or twisting ("BLT"). Avoid lifting objects heavier than 10 pounds for the first 6 weeks after surgery. Where possible, avoid household activities that involve lifting, bending, reaching, pushing, or pulling such as laundry, vacuuming, grocery shopping, and childcare. Try to arrange for help from friends and family for these activities while you heal. Do not drive while taking prescription pain medication. Weeks 6 through 12 after surgery: avoid lifting more than 25 pounds.     How to contact us:  If you have any questions/concerns before or after surgery, you can reach [place] at [phone], or you can send a mychart message. We can be reached by phone or mychart 8am-4pm, Monday-Friday.  *Please note: Calls after 4pm are forwarded to a third party answering service. Mychart messages are not routinely monitored during evenings,  weekends, and holidays. Please call our office to contact the answering service for urgent concerns during non-business hours.     If you have FMLA/disability paperwork, please drop it off or fax it to [phone], attention Patty.   Appointments/FMLA & disability paperwork: Patty & Cristin  Nurse: Royston Cowper  Medical assistants: Laurann Montana, & Lyla Son Physician Assistants: Manning Charity & Drake Leach Surgeons: Venetia Night, MD & Ernestine Mcmurray, MD

## 2022-12-06 NOTE — Telephone Encounter (Signed)
I spoke with Johnny Liu. He would like to proceed with surgery on 12/21/22. He will call me back tomorrow to discuss instructions and to schedule his post-op appointments.

## 2022-12-07 NOTE — Telephone Encounter (Signed)
I spoke with Mr Nudd. We discussed the following:  Planned surgery: C5-6 posterior cervical discectomy   Surgery date: 12/21/22 at [facility]: [address] [place], [place] [address]) - you will find out your arrival time the business day before your surgery.   Semaglutide injection: hold 7 days prior to surgery   Pre-op appointment at [facility] Pre-admit Testing: we will call you with a date/time for this. If you are scheduled for an in person appointment, Pre-admit Testing is located on the first floor of the [facility] building, [address] [address], [address]. Please bring all prescriptions in the original prescription bottles to your appointment. During this appointment, they will advise you which medications you can take the morning of surgery, and which medications you will need to hold for surgery. Labs (such as blood work, EKG) may be done at your pre-op appointment. You are not required to fast for these labs. Should you need to change your pre-op appointment, please call Pre-admit testing at [phone].      Common restrictions after surgery: No bending, lifting, or twisting ("BLT"). Avoid lifting objects heavier than 10 pounds for the first 6 weeks after surgery. Where possible, avoid household activities that involve lifting, bending, reaching, pushing, or pulling such as laundry, vacuuming, grocery shopping, and childcare. Try to arrange for help from friends and family for these activities while you heal. Do not drive while taking prescription pain medication. Weeks 6 through 12 after surgery: avoid lifting more than 25 pounds.     How to contact us:  If you have any questions/concerns before or after surgery, you can reach [place] at [phone], or you can send a mychart message. We can be reached by phone or mychart 8am-4pm, Monday-Friday.  *Please note: Calls after 4pm are forwarded to a third party answering service. Mychart  messages are not routinely monitored during evenings, weekends, and holidays. Please call our office to contact the answering service for urgent concerns during non-business hours.     If you have FMLA/disability paperwork, please drop it off or fax it to [phone], attention Patty.   Appointments/FMLA & disability paperwork: Patty & Cristin  Nurse: Royston Cowper  Medical assistants: Laurann Montana, & Lyla Son Physician Assistants: Manning Charity & Drake Leach Surgeons: Venetia Night, MD & Ernestine Mcmurray, MD

## 2022-12-12 ENCOUNTER — Encounter: Payer: Self-pay | Admitting: Internal Medicine

## 2022-12-12 ENCOUNTER — Ambulatory Visit: Payer: BC Managed Care – PPO | Admitting: Internal Medicine

## 2022-12-12 VITALS — BP 120/78 | HR 107 | Ht 68.0 in | Wt 228.0 lb

## 2022-12-12 DIAGNOSIS — H10023 Other mucopurulent conjunctivitis, bilateral: Secondary | ICD-10-CM | POA: Diagnosis not present

## 2022-12-12 DIAGNOSIS — R030 Elevated blood-pressure reading, without diagnosis of hypertension: Secondary | ICD-10-CM

## 2022-12-12 MED ORDER — NEOMYCIN-POLYMYXIN-DEXAMETH 3.5-10000-0.1 OP SUSP
2.0000 [drp] | Freq: Four times a day (QID) | OPHTHALMIC | 0 refills | Status: DC
Start: 2022-12-12 — End: 2023-04-17

## 2022-12-12 NOTE — Progress Notes (Signed)
Date:  12/12/2022   Name:  KESHAV SHOWMAN   DOB:  [birth year]   MRN:  [id number]   Chief Complaint: Eye Pain (Bilateral eyes. Yellow discharge coming from eyes, bottom eye lids are sore, itching.)  Eye Pain  Both eyes are affected. This is a new problem. The problem occurs constantly. The problem has been unchanged. There was no injury mechanism. The pain is mild. Associated symptoms include blurred vision and itching. Pertinent negatives include no fever. He has tried nothing for the symptoms.    Lab Results  Component Value Date   NA 137 05/31/2022   K 4.9 05/31/2022   CO2 20 05/31/2022   GLUCOSE 118 (H) 01/17/2021   BUN 18 05/31/2022   CREATININE 1.2 05/31/2022   CALCIUM 8.8 05/31/2022   EGFR 73 05/31/2022   GFRNONAA 74 10/02/2019   Lab Results  Component Value Date   CHOL 144 05/31/2022   HDL 53 01/17/2021   LDLCALC 84 01/17/2021   TRIG 57 05/31/2022   CHOLHDL 3.2 12/21/2015   Lab Results  Component Value Date   TSH 1.16 05/31/2022   Lab Results  Component Value Date   HGBA1C 5.6 01/17/2021   Lab Results  Component Value Date   WBC 6.2 05/31/2022   HGB 17.4 05/31/2022   HCT 51 05/31/2022   MCV 90 01/17/2021   PLT 206 05/31/2022   Lab Results  Component Value Date   ALT 46 (A) 05/31/2022   AST 35 05/31/2022   ALKPHOS 53 05/31/2022   BILITOT 0.2 10/02/2019   Lab Results  Component Value Date   VD25OH 31.1 05/31/2022     Review of Systems  Constitutional:  Negative for chills and fever.  Eyes:  Positive for blurred vision, pain and itching.  Respiratory:  Negative for chest tightness and shortness of breath.   Cardiovascular:  Negative for chest pain.  Neurological:  Positive for headaches. Negative for dizziness.    Patient Active Problem List   Diagnosis Date Noted   Intervertebral cervical disc disorder with myelopathy, cervical region 11/29/2022   Cervicalgia 11/15/2022   Cervical radiculopathy 11/15/2022   Heartburn    Problems with  swallowing and mastication    Gastritis without bleeding    Stricture and stenosis of esophagus    Abnormal feces    Benign neoplasm of cecum    Polyp of sigmoid colon    Chronic prostatitis 09/23/2012   Hypertrophy of prostate with urinary obstruction and other lower urinary tract symptoms (LUTS) 09/23/2012   ED (erectile dysfunction) of organic origin 09/23/2012   Neoplasm of uncertain behavior of axilla 09/23/2012   Other specified disorder of male genital organs(608.89) 09/23/2012    No Known Allergies  Past Surgical History:  Procedure Laterality Date   APPENDECTOMY     COLONOSCOPY WITH PROPOFOL N/A 08/31/2016   Procedure: COLONOSCOPY WITH PROPOFOL;  Surgeon: Midge Minium, MD;  Location: [facility];  Service: Endoscopy;  Laterality: N/A;   ESOPHAGEAL DILATION  08/31/2016   Procedure: ESOPHAGEAL DILATION;  Surgeon: Midge Minium, MD;  Location: [facility];  Service: Endoscopy;;   ESOPHAGOGASTRODUODENOSCOPY (EGD) WITH PROPOFOL N/A 08/31/2016   Procedure: ESOPHAGOGASTRODUODENOSCOPY (EGD) WITH PROPOFOL;  Surgeon: Midge Minium, MD;  Location: [facility];  Service: Endoscopy;  Laterality: N/A;    Social History   Tobacco Use   Smoking status: Never   Smokeless tobacco: Never  Vaping Use   Vaping status: Never Used  Substance Use Topics   Alcohol use: Yes  Alcohol/week: 7.0 standard drinks of alcohol    Types: 7 Cans of beer per week   Drug use: No     Medication list has been reviewed and updated.  Current Meds  Medication Sig   clotrimazole-betamethasone (LOTRISONE) cream Apply 1 application. topically daily.   cyclobenzaprine (FLEXERIL) 5 MG tablet TAKE (1) TABLET BY MOUTH THREE TIMES A DAY AS NEEDED FOR MUSCLE SPASMS   losartan (COZAAR) 50 MG tablet TAKE (1) TABLET BY MOUTH EVERY DAY   Multiple Vitamin (MULTIVITAMIN) tablet Take 1 tablet by mouth daily.   neomycin-polymyxin b-dexamethasone (MAXITROL) 3.5-10000-0.1 SUSP Place 2 drops into  both eyes every 6 (six) hours.   Semaglutide-Weight Management 0.5 MG/0.5ML SOAJ Inject 0.5 mg into the skin once a week.   sildenafil (VIAGRA) 50 MG tablet Take 1 tablet (50 mg total) by mouth daily as needed for erectile dysfunction.   testosterone cypionate (DEPOTESTOSTERONE CYPIONATE) 200 MG/ML injection Inject into the muscle. urology       12/12/2022   10:21 AM 07/21/2021   10:09 AM 09/23/2020   10:55 AM 03/18/2020    9:52 AM  GAD 7 : Generalized Anxiety Score  Nervous, Anxious, on Edge 0 0 0 0  Control/stop worrying 0 0 0 0  Worry too much - different things 0 0 0 0  Trouble relaxing 0 0 0 0  Restless 0 0 0 0  Easily annoyed or irritable 0 0 0 0  Afraid - awful might happen 0 0 0 0  Total GAD 7 Score 0 0 0 0  Anxiety Difficulty Not difficult at all Not difficult at all         12/12/2022   10:21 AM 07/21/2021   10:09 AM 09/23/2020   10:54 AM  Depression screen PHQ 2/9  Decreased Interest 0 0 0  Down, Depressed, Hopeless 0 0 0  PHQ - 2 Score 0 0 0  Altered sleeping 0 0 0  Tired, decreased energy 0 0 2  Change in appetite 0 0 0  Feeling bad or failure about yourself  0 0 0  Trouble concentrating 0 0 1  Moving slowly or fidgety/restless 0 0 0  Suicidal thoughts 0 0 0  PHQ-9 Score 0 0 3  Difficult doing work/chores Not difficult at all Not difficult at all Not difficult at all    BP Readings from Last 3 Encounters:  12/12/22 120/78  11/15/22 122/82  06/23/22 124/78    Physical Exam Vitals and nursing note reviewed.  Constitutional:      General: He is not in acute distress.    Appearance: He is well-developed.  HENT:     Head: Normocephalic and atraumatic.  Eyes:     Extraocular Movements: Extraocular movements intact.     Conjunctiva/sclera:     Right eye: Chemosis present.     Left eye: Chemosis present.  Cardiovascular:     Rate and Rhythm: Normal rate and regular rhythm.  Pulmonary:     Effort: Pulmonary effort is normal. No respiratory distress.      Breath sounds: No wheezing or rhonchi.  Musculoskeletal:     Cervical back: Normal range of motion.  Skin:    General: Skin is warm and dry.     Findings: No rash.  Neurological:     Mental Status: He is alert and oriented to person, place, and time.  Psychiatric:        Mood and Affect: Mood normal.        Behavior: Behavior  normal.     Wt Readings from Last 3 Encounters:  12/12/22 228 lb (103.4 kg)  11/15/22 224 lb 9.6 oz (101.9 kg)  06/23/22 238 lb (108 kg)    BP 120/78 (BP Location: Left Arm, Cuff Size: Large)   Pulse (!) 107   Ht 5\' 8"  (1.727 m)   Wt 228 lb (103.4 kg)   SpO2 98%   BMI 34.67 kg/m   Assessment and Plan:  Problem List Items Addressed This Visit   None Visit Diagnoses     Other mucopurulent conjunctivitis of both eyes    -  Primary   Relevant Medications   neomycin-polymyxin b-dexamethasone (MAXITROL) 3.5-10000-0.1 SUSP   Elevated blood pressure reading       he is no longer taking Losartan he has made lifestyle changes and BP at home has been good will continue to monitor off of medications       No follow-ups on file.    Reubin Milan, MD [facility] [place]

## 2022-12-13 ENCOUNTER — Other Ambulatory Visit: Payer: Self-pay

## 2022-12-13 ENCOUNTER — Encounter
Admission: RE | Admit: 2022-12-13 | Discharge: 2022-12-13 | Disposition: A | Discharge status: Home / Self Care | Payer: BC Managed Care – PPO | Source: Ambulatory Visit | Attending: Neurosurgery | Admitting: Neurosurgery

## 2022-12-13 VITALS — BP 133/85 | HR 93 | Resp 16

## 2022-12-13 DIAGNOSIS — Z01818 Encounter for other preprocedural examination: Secondary | ICD-10-CM | POA: Diagnosis not present

## 2022-12-13 DIAGNOSIS — I10 Essential (primary) hypertension: Secondary | ICD-10-CM | POA: Diagnosis not present

## 2022-12-13 DIAGNOSIS — Z01810 Encounter for preprocedural cardiovascular examination: Secondary | ICD-10-CM | POA: Diagnosis not present

## 2022-12-13 DIAGNOSIS — Z01812 Encounter for preprocedural laboratory examination: Secondary | ICD-10-CM

## 2022-12-13 HISTORY — DX: Male erectile dysfunction, unspecified: N52.9

## 2022-12-13 HISTORY — DX: Dysphagia, unspecified: R13.10

## 2022-12-13 HISTORY — DX: Chronic prostatitis: N41.1

## 2022-12-13 HISTORY — DX: Essential (primary) hypertension: I10

## 2022-12-13 HISTORY — DX: Cervical disc disorder with myelopathy, unspecified cervical region: M50.00

## 2022-12-13 HISTORY — DX: COVID-19: U07.1

## 2022-12-13 HISTORY — DX: Other obstructive and reflux uropathy: N40.1

## 2022-12-13 HISTORY — DX: Benign prostatic hyperplasia with lower urinary tract symptoms: N13.8

## 2022-12-13 HISTORY — DX: Unspecified osteoarthritis, unspecified site: M19.90

## 2022-12-13 HISTORY — DX: Esophageal obstruction: K22.2

## 2022-12-13 HISTORY — DX: Radiculopathy, cervical region: M54.12

## 2022-12-13 LAB — CBC
HCT: 48 % (ref 39.0–52.0)
Hemoglobin: 16.6 g/dL (ref 13.0–17.0)
MCH: 30.4 pg (ref 26.0–34.0)
MCHC: 34.6 g/dL (ref 30.0–36.0)
MCV: 87.9 fL (ref 80.0–100.0)
Platelets: 213 10*3/uL (ref 150–400)
RBC: 5.46 MIL/uL (ref 4.22–5.81)
RDW: 12.7 % (ref 11.5–15.5)
WBC: 7.6 10*3/uL (ref 4.0–10.5)
nRBC: 0 % (ref 0.0–0.2)

## 2022-12-13 LAB — TYPE AND SCREEN
ABO/RH(D): O NEG
Antibody Screen: NEGATIVE

## 2022-12-13 LAB — BASIC METABOLIC PANEL
Anion gap: 7 (ref 5–15)
BUN: 17 mg/dL (ref 6–20)
CO2: 26 mmol/L (ref 22–32)
Calcium: 8.9 mg/dL (ref 8.9–10.3)
Chloride: 103 mmol/L (ref 98–111)
Creatinine, Ser: 1.14 mg/dL (ref 0.61–1.24)
GFR, Estimated: >60 mL/min (ref >60)
Glucose, Bld: 75 mg/dL (ref 70–99)
Potassium: 4 mmol/L (ref 3.5–5.1)
Sodium: 136 mmol/L (ref 135–145)

## 2022-12-13 LAB — URINALYSIS, ROUTINE W REFLEX MICROSCOPIC
Bilirubin Urine: NEGATIVE
Glucose, UA: NEGATIVE mg/dL
Hgb urine dipstick: NEGATIVE
Ketones, ur: NEGATIVE mg/dL
Leukocytes,Ua: NEGATIVE
Nitrite: NEGATIVE
Protein, ur: NEGATIVE mg/dL
Specific Gravity, Urine: 1.009 (ref 1.005–1.030)
pH: 5 (ref 5.0–8.0)

## 2022-12-13 LAB — SURGICAL PCR SCREEN
MRSA, PCR: NEGATIVE
Staphylococcus aureus: NEGATIVE

## 2022-12-13 NOTE — Patient Instructions (Addendum)
Your procedure is scheduled on: 12/21/22 - Thursday Report to the Registration Desk on the 1st floor of the [facility]. To find out your arrival time, please call [phone] between 1PM - 3PM on: 12/20/22 - Wednesday If your arrival time is 6:00 am, do not arrive before that time as the [facility] entrance doors do not open until 6:00 am.  REMEMBER: Instructions that are not followed completely may result in serious medical risk, up to and including death; or upon the discretion of your surgeon and anesthesiologist your surgery may need to be rescheduled.  Do not eat food after midnight the night before surgery.  No gum chewing or hard candies.  You may however, drink CLEAR liquids up to 2 hours before you are scheduled to arrive for your surgery. Do not drink anything within 2 hours of your scheduled arrival time.  Clear liquids include: - water  - apple juice without pulp - gatorade (not RED colors) - black coffee or tea (Do NOT add milk or creamers to the coffee or tea) Do NOT drink anything that is not on this list.   You may take  Anti-inflammatories (NSAIDS) such as Advil, Aleve, Ibuprofen, Motrin, Naproxen, Naprosyn and Aspirin based products such as Excedrin, Goody's Powder, BC Powder. You may take Tylenol if needed for pain up until the day of surgery.  Stop ANY OVER THE COUNTER supplements until after surgery.  sildenafil (VIAGRA) hold 2 days prior to your surgery.   TAKE ONLY THESE MEDICATIONS THE MORNING OF SURGERY WITH A SIP OF WATER:  none   No Alcohol for 24 hours before or after surgery.  No Smoking including e-cigarettes for 24 hours before surgery.  No chewable tobacco products for at least 6 hours before surgery.  No nicotine patches on the day of surgery.  Do not use any "recreational" drugs for at least a week (preferably 2 weeks) before your surgery.  Please be advised that the combination of cocaine and anesthesia may have negative outcomes, up  to and including death. If you test positive for cocaine, your surgery will be cancelled.  On the morning of surgery brush your teeth with toothpaste and water, you may rinse your mouth with mouthwash if you wish. Do not swallow any toothpaste or mouthwash.  Use CHG Soap or wipes as directed on instruction sheet.  Do not wear jewelry, make-up, hairpins, clips or nail polish.  For welded (permanent) jewelry: bracelets, anklets, waist bands, etc.  Please have this removed prior to surgery.  If it is not removed, there is a chance that hospital personnel will need to cut it off on the day of surgery.  Do not wear lotions, powders, or perfumes.   Do not shave body hair from the neck down 48 hours before surgery.  Contact lenses, hearing aids and dentures may not be worn into surgery.  Do not bring valuables to the hospital. [facility] is not responsible for any missing/lost belongings or valuables. .   Notify your doctor if there is any change in your medical condition (cold, fever, infection).  Wear comfortable clothing (specific to your surgery type) to the hospital.  After surgery, you can help prevent lung complications by doing breathing exercises.  Take deep breaths and cough every 1-2 hours. Your doctor may order a device called an Incentive Spirometer to help you take deep breaths. When coughing or sneezing, hold a pillow firmly against your incision with both hands. This is called "splinting." Doing this helps  protect your incision. It also decreases belly discomfort.  If you are being admitted to the hospital overnight, leave your suitcase in the car. After surgery it may be brought to your room.  In case of increased patient census, it may be necessary for you, the patient, to continue your postoperative care in the Same Day Surgery department.  If you are being discharged the day of surgery, you will not be allowed to drive home. You will need a responsible individual to  drive you home and stay with you for 24 hours after surgery.   If you are taking public transportation, you will need to have a responsible individual with you.  Please call the Pre-admissions Testing Dept. at [phone] if you have any questions about these instructions.  Surgery Visitation Policy:  Patients having surgery or a procedure may have two visitors.  Children under the age of 59 must have an adult with them who is not the patient.  Inpatient Visitation:    Visiting hours are 7 a.m. to 8 p.m. Up to four visitors are allowed at one time in a patient room. The visitors may rotate out with other people during the day.  One visitor age 51 or older may stay with the patient overnight and must be in the room by 8 p.m.    Pre-operative 5 CHG Bath Instructions   You can play a key role in reducing the risk of infection after surgery. Your skin needs to be as free of germs as possible. You can reduce the number of germs on your skin by washing with CHG (chlorhexidine gluconate) soap before surgery. CHG is an antiseptic soap that kills germs and continues to kill germs even after washing.   DO NOT use if you have an allergy to chlorhexidine/CHG or antibacterial soaps. If your skin becomes reddened or irritated, stop using the CHG and notify one of our RNs at [phone].   Please shower with the CHG soap starting 4 days before surgery using the following schedule: 09/15 - 09/19.    Please keep in mind the following:  DO NOT shave, including legs and underarms, starting the day of your first shower.   You may shave your face at any point before/day of surgery.  Place clean sheets on your bed the day you start using CHG soap. Use a clean washcloth (not used since being washed) for each shower. DO NOT sleep with pets once you start using the CHG.   CHG Shower Instructions:  If you choose to wash your hair and private area, wash first with your normal shampoo/soap.  After you  use shampoo/soap, rinse your hair and body thoroughly to remove shampoo/soap residue.  Turn the water OFF and apply about 3 tablespoons (45 ml) of CHG soap to a CLEAN washcloth.  Apply CHG soap ONLY FROM YOUR NECK DOWN TO YOUR TOES (washing for 3-5 minutes)  DO NOT use CHG soap on face, private areas, open wounds, or sores.  Pay special attention to the area where your surgery is being performed.  If you are having back surgery, having someone wash your back for you may be helpful. Wait 2 minutes after CHG soap is applied, then you may rinse off the CHG soap.  Pat dry with a clean towel  Put on clean clothes/pajamas   If you choose to wear lotion, please use ONLY the CHG-compatible lotions on the back of this paper.     Additional instructions for the day of surgery:  DO NOT APPLY any lotions, deodorants, cologne, or perfumes.   Put on clean/comfortable clothes.  Brush your teeth.  Ask your nurse before applying any prescription medications to the skin.      CHG Compatible Lotions   Aveeno Moisturizing lotion  Cetaphil Moisturizing Cream  Cetaphil Moisturizing Lotion  Clairol Herbal Essence Moisturizing Lotion, Dry Skin  Clairol Herbal Essence Moisturizing Lotion, Extra Dry Skin  Clairol Herbal Essence Moisturizing Lotion, Normal Skin  Curel Age Defying Therapeutic Moisturizing Lotion with Alpha Hydroxy  Curel Extreme Care Body Lotion  Curel Soothing Hands Moisturizing Hand Lotion  Curel Therapeutic Moisturizing Cream, Fragrance-Free  Curel Therapeutic Moisturizing Lotion, Fragrance-Free  Curel Therapeutic Moisturizing Lotion, Original Formula  Eucerin Daily Replenishing Lotion  Eucerin Dry Skin Therapy Plus Alpha Hydroxy Crme  Eucerin Dry Skin Therapy Plus Alpha Hydroxy Lotion  Eucerin Original Crme  Eucerin Original Lotion  Eucerin Plus Crme Eucerin Plus Lotion  Eucerin TriLipid Replenishing Lotion  Keri Anti-Bacterial Hand Lotion  Keri Deep Conditioning Original Lotion  Dry Skin Formula Softly Scented  Keri Deep Conditioning Original Lotion, Fragrance Free Sensitive Skin Formula  Keri Lotion Fast Absorbing Fragrance Free Sensitive Skin Formula  Keri Lotion Fast Absorbing Softly Scented Dry Skin Formula  Keri Original Lotion  Keri Skin Renewal Lotion Keri Silky Smooth Lotion  Keri Silky Smooth Sensitive Skin Lotion  Nivea Body Creamy Conditioning Oil  Nivea Body Extra Enriched Teacher, adult education Moisturizing Lotion Nivea Crme  Nivea Skin Firming Lotion  NutraDerm 30 Skin Lotion  NutraDerm Skin Lotion  NutraDerm Therapeutic Skin Cream  NutraDerm Therapeutic Skin Lotion  ProShield Protective Hand Cream  Provon moisturizing lotion

## 2022-12-21 ENCOUNTER — Encounter: Payer: Self-pay | Admitting: Neurosurgery

## 2022-12-21 ENCOUNTER — Encounter
Admission: RE | Disposition: A | Discharge status: Home / Self Care | Payer: Self-pay | Source: Home / Self Care | Attending: Neurosurgery

## 2022-12-21 ENCOUNTER — Telehealth: Payer: Self-pay

## 2022-12-21 ENCOUNTER — Other Ambulatory Visit: Payer: Self-pay

## 2022-12-21 ENCOUNTER — Ambulatory Visit (INDEPENDENT_AMBULATORY_CARE_PROVIDER_SITE_OTHER): Payer: BC Managed Care – PPO | Admitting: Neurosurgery

## 2022-12-21 ENCOUNTER — Ambulatory Visit: Payer: BC Managed Care – PPO | Admitting: Anesthesiology

## 2022-12-21 ENCOUNTER — Ambulatory Visit: Payer: BC Managed Care – PPO

## 2022-12-21 ENCOUNTER — Ambulatory Visit
Admission: RE | Admit: 2022-12-21 | Discharge: 2022-12-21 | Disposition: A | Discharge status: Home / Self Care | Payer: BC Managed Care – PPO | Attending: Neurosurgery | Admitting: Neurosurgery

## 2022-12-21 ENCOUNTER — Ambulatory Visit: Payer: BC Managed Care – PPO | Admitting: Urgent Care

## 2022-12-21 DIAGNOSIS — M50022 Cervical disc disorder at C5-C6 level with myelopathy: Secondary | ICD-10-CM

## 2022-12-21 DIAGNOSIS — M5010 Cervical disc disorder with radiculopathy, unspecified cervical region: Secondary | ICD-10-CM | POA: Diagnosis not present

## 2022-12-21 DIAGNOSIS — M5000 Cervical disc disorder with myelopathy, unspecified cervical region: Secondary | ICD-10-CM | POA: Diagnosis present

## 2022-12-21 DIAGNOSIS — M5412 Radiculopathy, cervical region: Secondary | ICD-10-CM | POA: Diagnosis present

## 2022-12-21 DIAGNOSIS — Z8616 Personal history of COVID-19: Secondary | ICD-10-CM | POA: Diagnosis not present

## 2022-12-21 DIAGNOSIS — K219 Gastro-esophageal reflux disease without esophagitis: Secondary | ICD-10-CM | POA: Diagnosis not present

## 2022-12-21 DIAGNOSIS — Z0189 Encounter for other specified special examinations: Secondary | ICD-10-CM | POA: Diagnosis not present

## 2022-12-21 DIAGNOSIS — M50122 Cervical disc disorder at C5-C6 level with radiculopathy: Secondary | ICD-10-CM | POA: Diagnosis not present

## 2022-12-21 DIAGNOSIS — I10 Essential (primary) hypertension: Secondary | ICD-10-CM | POA: Insufficient documentation

## 2022-12-21 DIAGNOSIS — Z01818 Encounter for other preprocedural examination: Secondary | ICD-10-CM

## 2022-12-21 DIAGNOSIS — M542 Cervicalgia: Secondary | ICD-10-CM | POA: Diagnosis present

## 2022-12-21 DIAGNOSIS — Z09 Encounter for follow-up examination after completed treatment for conditions other than malignant neoplasm: Secondary | ICD-10-CM

## 2022-12-21 HISTORY — PX: POSTERIOR CERVICAL LAMINECTOMY: SHX2248

## 2022-12-21 LAB — ABO/RH: ABO/RH(D): O NEG

## 2022-12-21 SURGERY — POSTERIOR CERVICAL LAMINECTOMY
Anesthesia: General | Site: Neck | Laterality: Left

## 2022-12-21 MED ORDER — OXYCODONE HCL 5 MG PO TABS: 5.0000 mg | ORAL_TABLET | ORAL | 0 refills | Status: AC | PRN

## 2022-12-21 MED ORDER — BACITRACIN ZINC 500 UNIT/GM EX OINT
TOPICAL_OINTMENT | CUTANEOUS | Status: AC
  Filled 2022-12-21: qty 28.35

## 2022-12-21 MED ORDER — CHLORHEXIDINE GLUCONATE 0.12 % MT SOLN
OROMUCOSAL | Status: AC
  Filled 2022-12-21: qty 15

## 2022-12-21 MED ORDER — CHLORHEXIDINE GLUCONATE 0.12 % MT SOLN
15.0000 mL | Freq: Once | OROMUCOSAL | Status: AC
  Administered 2022-12-21: 15 mL via OROMUCOSAL

## 2022-12-21 MED ORDER — DEXMEDETOMIDINE HCL IN NACL 80 MCG/20ML IV SOLN
INTRAVENOUS | Status: DC | PRN
Start: 2022-12-21 — End: 2022-12-21
  Administered 2022-12-21: 8 ug via INTRAVENOUS

## 2022-12-21 MED ORDER — MIDAZOLAM HCL 2 MG/2ML IJ SOLN
INTRAMUSCULAR | Status: DC | PRN
  Administered 2022-12-21: 2 mg via INTRAVENOUS

## 2022-12-21 MED ORDER — ONDANSETRON HCL 4 MG/2ML IJ SOLN
INTRAMUSCULAR | Status: DC | PRN
  Administered 2022-12-21: 4 mg via INTRAVENOUS

## 2022-12-21 MED ORDER — FENTANYL CITRATE (PF) 100 MCG/2ML IJ SOLN
INTRAMUSCULAR | Status: AC
  Filled 2022-12-21: qty 2

## 2022-12-21 MED ORDER — FENTANYL CITRATE (PF) 100 MCG/2ML IJ SOLN
INTRAMUSCULAR | Status: DC | PRN
  Administered 2022-12-21 (×3): 50 ug via INTRAVENOUS

## 2022-12-21 MED ORDER — PROPOFOL 1000 MG/100ML IV EMUL
INTRAVENOUS | Status: AC
  Filled 2022-12-21: qty 100

## 2022-12-21 MED ORDER — CEFAZOLIN SODIUM-DEXTROSE 2-4 GM/100ML-% IV SOLN
INTRAVENOUS | Status: AC
  Filled 2022-12-21: qty 100

## 2022-12-21 MED ORDER — ACETAMINOPHEN 10 MG/ML IV SOLN
1000.0000 mg | Freq: Once | INTRAVENOUS | Status: DC | PRN
  Administered 2022-12-21: 1000 mg via INTRAVENOUS

## 2022-12-21 MED ORDER — ROCURONIUM BROMIDE 100 MG/10ML IV SOLN
INTRAVENOUS | Status: DC | PRN
  Administered 2022-12-21: 40 mg via INTRAVENOUS

## 2022-12-21 MED ORDER — SODIUM CHLORIDE (PF) 0.9 % IJ SOLN
INTRAMUSCULAR | Status: AC
  Filled 2022-12-21: qty 20

## 2022-12-21 MED ORDER — PHENYLEPHRINE HCL-NACL 20-0.9 MG/250ML-% IV SOLN
INTRAVENOUS | Status: DC | PRN
Start: 2022-12-21 — End: 2022-12-21
  Administered 2022-12-21: 50 ug/min via INTRAVENOUS
  Administered 2022-12-21: 160 ug via INTRAVENOUS

## 2022-12-21 MED ORDER — OXYCODONE HCL 5 MG PO TABS
5.0000 mg | ORAL_TABLET | Freq: Once | ORAL | Status: AC | PRN
  Administered 2022-12-21: 5 mg via ORAL

## 2022-12-21 MED ORDER — PROPOFOL 10 MG/ML IV BOLUS
INTRAVENOUS | Status: DC | PRN
  Administered 2022-12-21: 150 ug/kg/min via INTRAVENOUS
  Administered 2022-12-21: 100 mg via INTRAVENOUS
  Administered 2022-12-21: 200 mg via INTRAVENOUS

## 2022-12-21 MED ORDER — REMIFENTANIL HCL 1 MG IV SOLR
INTRAVENOUS | Status: DC | PRN
Start: 2022-12-21 — End: 2022-12-21
  Administered 2022-12-21: .2 ug/kg/min via INTRAVENOUS

## 2022-12-21 MED ORDER — FAMOTIDINE 20 MG PO TABS
20.0000 mg | ORAL_TABLET | Freq: Once | ORAL | Status: AC
  Administered 2022-12-21: 20 mg via ORAL

## 2022-12-21 MED ORDER — FAMOTIDINE 20 MG PO TABS
ORAL_TABLET | ORAL | Status: AC
  Filled 2022-12-21: qty 1

## 2022-12-21 MED ORDER — REMIFENTANIL HCL 1 MG IV SOLR
INTRAVENOUS | Status: AC
  Filled 2022-12-21: qty 1000

## 2022-12-21 MED ORDER — OXYCODONE HCL 5 MG PO TABS
ORAL_TABLET | ORAL | Status: AC
  Filled 2022-12-21: qty 1

## 2022-12-21 MED ORDER — DROPERIDOL 2.5 MG/ML IJ SOLN: 0.6250 mg | Freq: Once | INTRAMUSCULAR | Status: DC | PRN

## 2022-12-21 MED ORDER — PROPOFOL 10 MG/ML IV BOLUS
INTRAVENOUS | Status: AC
  Filled 2022-12-21: qty 20

## 2022-12-21 MED ORDER — CEFAZOLIN SODIUM-DEXTROSE 2-4 GM/100ML-% IV SOLN
2.0000 g | Freq: Once | INTRAVENOUS | Status: AC
  Administered 2022-12-21: 2 g via INTRAVENOUS

## 2022-12-21 MED ORDER — PROMETHAZINE HCL 25 MG/ML IJ SOLN: 6.2500 mg | INTRAMUSCULAR | Status: DC | PRN

## 2022-12-21 MED ORDER — BUPIVACAINE HCL (PF) 0.5 % IJ SOLN
INTRAMUSCULAR | Status: AC
  Filled 2022-12-21: qty 30

## 2022-12-21 MED ORDER — GLYCOPYRROLATE 0.2 MG/ML IJ SOLN
INTRAMUSCULAR | Status: DC | PRN
Start: 2022-12-21 — End: 2022-12-21
  Administered 2022-12-21 (×2): .1 mg via INTRAVENOUS

## 2022-12-21 MED ORDER — DEXAMETHASONE SODIUM PHOSPHATE 10 MG/ML IJ SOLN
INTRAMUSCULAR | Status: DC | PRN
  Administered 2022-12-21: 10 mg via INTRAVENOUS

## 2022-12-21 MED ORDER — VASOPRESSIN 20 UNIT/ML IV SOLN
INTRAVENOUS | Status: DC | PRN
Start: 2022-12-21 — End: 2022-12-21
  Administered 2022-12-21 (×3): 1 [IU] via INTRAVENOUS

## 2022-12-21 MED ORDER — PHENYLEPHRINE HCL-NACL 20-0.9 MG/250ML-% IV SOLN
INTRAVENOUS | Status: AC
  Filled 2022-12-21: qty 250

## 2022-12-21 MED ORDER — BUPIVACAINE-EPINEPHRINE (PF) 0.5% -1:200000 IJ SOLN
INTRAMUSCULAR | Status: DC | PRN
  Administered 2022-12-21: 9 mL

## 2022-12-21 MED ORDER — MIDAZOLAM HCL 2 MG/2ML IJ SOLN
INTRAMUSCULAR | Status: AC
  Filled 2022-12-21: qty 2

## 2022-12-21 MED ORDER — LACTATED RINGERS IV SOLN: INTRAVENOUS | Status: DC | PRN

## 2022-12-21 MED ORDER — ORAL CARE MOUTH RINSE: 15.0000 mL | Freq: Once | OROMUCOSAL | Status: AC

## 2022-12-21 MED ORDER — METHYLPREDNISOLONE ACETATE 40 MG/ML IJ SUSP
INTRAMUSCULAR | Status: AC
  Filled 2022-12-21: qty 1

## 2022-12-21 MED ORDER — OXYCODONE HCL 5 MG/5ML PO SOLN: 5.0000 mg | Freq: Once | ORAL | Status: AC | PRN

## 2022-12-21 MED ORDER — 0.9 % SODIUM CHLORIDE (POUR BTL) OPTIME
TOPICAL | Status: DC | PRN
  Administered 2022-12-21: 500 mL

## 2022-12-21 MED ORDER — ESMOLOL HCL 100 MG/10ML IV SOLN
INTRAVENOUS | Status: DC | PRN
Start: 2022-12-21 — End: 2022-12-21
  Administered 2022-12-21 (×2): 50 mg via INTRAVENOUS

## 2022-12-21 MED ORDER — BACITRACIN ZINC 500 UNIT/GM EX OINT
TOPICAL_OINTMENT | CUTANEOUS | Status: DC | PRN
  Administered 2022-12-21: 1 via TOPICAL

## 2022-12-21 MED ORDER — SURGIFLO WITH THROMBIN (HEMOSTATIC MATRIX KIT) OPTIME
TOPICAL | Status: DC | PRN
  Administered 2022-12-21: 2 via TOPICAL

## 2022-12-21 MED ORDER — BUPIVACAINE LIPOSOME 1.3 % IJ SUSP
INTRAMUSCULAR | Status: AC
  Filled 2022-12-21: qty 20

## 2022-12-21 MED ORDER — ACETAMINOPHEN 10 MG/ML IV SOLN
INTRAVENOUS | Status: AC
  Filled 2022-12-21: qty 100

## 2022-12-21 MED ORDER — FENTANYL CITRATE (PF) 100 MCG/2ML IJ SOLN: 25.0000 ug | INTRAMUSCULAR | Status: DC | PRN

## 2022-12-21 MED ORDER — METHYLPREDNISOLONE ACETATE 40 MG/ML IJ SUSP
INTRAMUSCULAR | Status: DC | PRN
Start: 2022-12-21 — End: 2022-12-21
  Administered 2022-12-21: 40 mg

## 2022-12-21 MED ORDER — BUPIVACAINE-EPINEPHRINE (PF) 0.5% -1:200000 IJ SOLN
INTRAMUSCULAR | Status: AC
  Filled 2022-12-21: qty 10

## 2022-12-21 MED ORDER — SODIUM CHLORIDE (PF) 0.9 % IJ SOLN
INTRAMUSCULAR | Status: DC | PRN
  Administered 2022-12-21: 20 mL via INTRAMUSCULAR

## 2022-12-21 MED ORDER — SODIUM CHLORIDE FLUSH 0.9 % IV SOLN
INTRAVENOUS | Status: AC
  Filled 2022-12-21: qty 20

## 2022-12-21 MED ORDER — LIDOCAINE HCL (CARDIAC) PF 100 MG/5ML IV SOSY
PREFILLED_SYRINGE | INTRAVENOUS | Status: DC | PRN
  Administered 2022-12-21: 100 mg via INTRAVENOUS

## 2022-12-21 MED ORDER — LACTATED RINGERS IV SOLN: INTRAVENOUS | Status: DC

## 2022-12-21 SURGICAL SUPPLY — 47 items
ADH SKN CLS APL DERMABOND .7 (GAUZE/BANDAGES/DRESSINGS) ×1
AGENT HMST KT MTR STRL THRMB (HEMOSTASIS) ×2
BASIN KIT SINGLE STR (MISCELLANEOUS) ×1 IMPLANT
BUR NEURO DRILL SOFT 3.0X3.8M (BURR) ×1 IMPLANT
DERMABOND ADVANCED .7 DNX12 (GAUZE/BANDAGES/DRESSINGS) ×1 IMPLANT
DRAPE C ARM PK CFD 31 SPINE (DRAPES) ×1 IMPLANT
DRAPE LAPAROTOMY 100X77 ABD (DRAPES) ×1 IMPLANT
DRAPE MICROSCOPE SPINE 48X150 (DRAPES) IMPLANT
DRSG OPSITE POSTOP 3X4 (GAUZE/BANDAGES/DRESSINGS) IMPLANT
ELECT EZSTD 165MM 6.5IN (MISCELLANEOUS) ×1
ELECTRODE EZSTD 165MM 6.5IN (MISCELLANEOUS) IMPLANT
FEE INTRAOP CADWELL SUPPLY NCS (MISCELLANEOUS) IMPLANT
FEE INTRAOP MONITOR IMPULS NCS (MISCELLANEOUS) IMPLANT
GLOVE BIOGEL PI IND STRL 6.5 (GLOVE) ×1 IMPLANT
GLOVE SURG SYN 6.5 ES PF (GLOVE) ×1 IMPLANT
GLOVE SURG SYN 6.5 PF PI (GLOVE) ×1 IMPLANT
GLOVE SURG SYN 8.5 E (GLOVE) ×3 IMPLANT
GLOVE SURG SYN 8.5 PF PI (GLOVE) ×3 IMPLANT
GOWN SRG LRG LVL 4 IMPRV REINF (GOWNS) ×1 IMPLANT
GOWN SRG XL LVL 3 NONREINFORCE (GOWNS) ×1 IMPLANT
GOWN STRL NON-REIN TWL XL LVL3 (GOWNS) ×1
GOWN STRL REIN LRG LVL4 (GOWNS) ×1
HEMOVAC 400CC 10FR (MISCELLANEOUS) ×1 IMPLANT
HOLDER FOLEY CATH W/STRAP (MISCELLANEOUS) ×1 IMPLANT
INTRAOP CADWELL SUPPLY FEE NCS (MISCELLANEOUS)
INTRAOP DISP SUPPLY FEE NCS (MISCELLANEOUS)
INTRAOP MONITOR FEE IMPULS NCS (MISCELLANEOUS)
KIT PREVENA INCISION MGT 13 (CANNISTER) ×1 IMPLANT
KIT SPINAL PRONEVIEW (KITS) ×1 IMPLANT
MANIFOLD NEPTUNE II (INSTRUMENTS) ×1 IMPLANT
MARKER SKIN DUAL TIP RULER LAB (MISCELLANEOUS) ×1 IMPLANT
NS IRRIG 1000ML POUR BTL (IV SOLUTION) ×1 IMPLANT
PACK LAMINECTOMY ARMC (PACKS) ×1 IMPLANT
PAD ARMBOARD 7.5X6 YLW CONV (MISCELLANEOUS) ×1 IMPLANT
PIN MAYFIELD SKULL DISP (PIN) IMPLANT
SOLUTION IRRIG SURGIPHOR (IV SOLUTION) ×1 IMPLANT
STAPLER SKIN PROX 35W (STAPLE) ×2 IMPLANT
SURGIFLO W/THROMBIN 8M KIT (HEMOSTASIS) ×1 IMPLANT
SUT ETHILON 3-0 FS-10 30 BLK (SUTURE)
SUT VIC AB 0 CT1 27 (SUTURE) ×2
SUT VIC AB 0 CT1 27XCR 8 STRN (SUTURE) ×3 IMPLANT
SUT VIC AB 2-0 CT1 18 (SUTURE) ×2 IMPLANT
SUTURE EHLN 3-0 FS-10 30 BLK (SUTURE) IMPLANT
SYR 30ML LL (SYRINGE) ×2 IMPLANT
TAPE CLOTH 3X10 WHT NS LF (GAUZE/BANDAGES/DRESSINGS) ×2 IMPLANT
TRAP FLUID SMOKE EVACUATOR (MISCELLANEOUS) ×1 IMPLANT
TRAY FOLEY SLVR 16FR LF STAT (SET/KITS/TRAYS/PACK) ×1 IMPLANT

## 2022-12-21 NOTE — Telephone Encounter (Signed)
I was contacted by Marylee Floras, RN, who informed me that Mr Johnny Liu wife contacted her due to concerns of post-op bleeding. Mr Colla has saturated his dressing, and his shirt. Per discussion with Dr Katrinka Blazing, Mr and Mrs Buggy will head to the office now for evaluation.

## 2022-12-21 NOTE — Interval H&P Note (Signed)
History and Physical Interval Note:  12/21/2022 7:13 AM  Johnny Liu  has presented today for surgery, with the diagnosis of M54.12  Cervical radiculopathy M50.00  Intervertebral cervical disc disorder with myelopathy, cervical region.  The various methods of treatment have been discussed with the patient and family. After consideration of risks, benefits and other options for treatment, the patient has consented to  Procedure(s): C5-6 POSTERIOR CERVICAL DISCECTOMY (Left) as a surgical intervention.  The patient's history has been reviewed, patient examined, no change in status, stable for surgery.  I have reviewed the patient's chart and labs.  Questions were answered to the patient's satisfaction.     Lovenia Kim
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
## 2022-12-21 NOTE — Transfer of Care (Signed)
Immediate Anesthesia Transfer of Care Note  Patient: Johnny Liu  Procedure(s) Performed: C5-6 POSTERIOR CERVICAL DISCECTOMY (Left: Neck)  Patient Location: PACU  Anesthesia Type:General  Level of Consciousness: drowsy  Airway & Oxygen Therapy: Patient Spontanous Breathing and Patient connected to face mask oxygen  Post-op Assessment: Report given to RN and Post -op Vital signs reviewed and stable  Post vital signs: stable with oral airway and facemask 5L oxygen  Last Vitals:  Vitals Value Taken Time  BP 113/67 12/21/22 0954  Temp    Pulse 95 12/21/22 0957  Resp 32 12/21/22 0957  SpO2 96 % 12/21/22 0957  Vitals shown include unfiled device data.  Last Pain:  Vitals:   12/21/22 7564  TempSrc: Temporal  PainSc: 0-No pain         Complications: No notable events documented.

## 2022-12-21 NOTE — Op Note (Addendum)
Indications: [name] is a 57 year old man with 6 months of progressive cervical radiculopathy.  He had a left-sided cervical disc herniation at C5-6 which caused biceps weakness C6 region numbness and tingling, as well as pain.  Given his weakness on physical exam as well as decreased C6 reflexes we felt that he was symptomatic from a C6 radiculopathy.  We planned for a cervical discectomy posterior  Procedure: Left sided C5-6 posterior cervical discectomy  Findings: Herniated disc fragment at C5-6 causing compression of the nerve root well decompressed at the end of procedure  Preoperative Diagnosis:  Patient Active Problem List   Diagnosis Date Noted   Intervertebral cervical disc disorder with myelopathy, cervical region 11/29/2022   Cervicalgia 11/15/2022   Cervical radiculopathy 11/15/2022   Postoperative Diagnosis: same   EBL: 20 ml IVF: see anesthesia record Drains: none Disposition: Extubated and Stable to PACU Complications: none  No foley catheter was placed.   Preoperative Note: 57 year old man with a left-sided cervical radiculopathy causing weakness in his bicep.  He was watched conservatively for 6 months and continued to have progressive worsening.  Given his weakness in his bicep as well as lack of improvement with conservative care and he was taken to the operating room for a cervical discectomy.  Risks of surgery discussed include: infection, bleeding, stroke, coma, death, paralysis, CSF leak, nerve/spinal cord injury, numbness, tingling, weakness, complex regional pain syndrome, recurrent stenosis and/or disc herniation, vascular injury, development of instability, neck/back pain, need for further surgery, persistent symptoms, development of deformity, and the risks of anesthesia. They understood these risks and have agreed to proceed.  Operative Note:    The patient was then brought from the preoperative center with intravenous access established.  The patient  underwent general anesthesia and endotracheal tube intubation, then was rotated on the flattop table with gel rolls.  He was fixated into a Mayfield head holder device.  All of his pressure points were padded.  He was then positioned in place and preoperative x-rays were obtained verifying the levels visibility.  A small midline incision was planned, he is then prepped and draped in the usual fashion.  Comprehensive timeout was performed verifying patient's name, MRN, planned procedure.  After the timeout local anesthetic was infiltrated into the small midline incision.  Sharp incision was made down to the skin and subcutaneous tissues.  The paraspinal fascia was opened, the tissues were then dissected down to the lamina and facet space.  We utilized fluoroscopy to verify our level.  Once we are at the correct level we then placed McCullough retractors to give good visualization of his posterior lateral elements.  We were able to identify the inferior facet of C5 and the superior facet of C6.  Once we are in good visualization with good hemostasis we brought in the high-speed drill.  Starting on the C5 inferior lateral lamina, small laminotomy was made.  This was carried cranially.  He was then carried laterally with care taken to not take more than one third of the medial facet.  This was then brought caudally disconnecting the inferior medial portion of the facet joint.  We are able to then see the superior medial portion of the facet joint of C6.  We then utilized a high-speed drill to perform a laminotomy on the superior medial aspect of the C6 lamina.  This was brought down caudally until the ligamentum flavum was no longer seen.  We then brought this laterally to the same level of the superior  dissection.  We then staying laterally we then migrated cranially to perform the facetectomy.  At this point the medial facetectomy was loose and was removed with tissue rongeurs.  We are then able to identify the  ligamentum, the ligamentum was removed wit a Kerrison rongeur.  Once a Kerrison rongeur was able to complete the posterior decompression, we felt out the foramen and performed a foraminotomies.  This was done with the Kerrison.  We are able to then palpate the pedicle of C6, this was used to help [place] follow our way down to the C5-6 disc space.  At this point we could feel herniated disc fragment.  This was causing ventral compression of the nerve root.  Utilizing a nerve hook we were able to mobilize the fragment and remove it.  At the end of the decompression the nerve was well decompressed.  We could feel clearly at the foramen with no longer any compression.  The nerve appeared to be resting.  The wound was then copiously irrigated.  Hemostasis was obtained.  We then placed Depo-Medrol in the nerve root.  Wound was closed in multiple layers.  Long-acting anesthetic was placed into the peri-incisional area as well as the muscle.  Sterile dressing was applied after the superficial layer of glue was placed and dried.  The patient was then turned onto the bed and Mayfield head holder was removed without complication  Patient was then rotated back to the preoperative bed awakened from anesthesia and taken to recovery all counts are correct in this case.  I performed the entire procedure with the assistance of Manning Charity PA as an Designer, television/film set, they helped with tissue retraction, exposure, protection of critical neural elements, and closure, I performed all the critical portions of the case.   Lovenia Kim, MD

## 2022-12-21 NOTE — Discharge Instructions (Addendum)
Your surgeon has performed an operation on your cervical spine (neck) to relieve pressure on one or more nerves. Many times, patients feel better immediately after surgery and can "overdo it." Even if you feel well, it is important that you follow these activity guidelines. If you do not let your neck heal properly from the surgery, you can increase the chance of a disc herniation and/or return of your symptoms. The following are instructions to help in your recovery once you have been discharged from the hospital.  * It is ok to take NSAIDs after surgery.  Activity    No bending, lifting, or twisting ("BLT"). Avoid lifting objects heavier than 10 pounds (gallon milk jug).  Where possible, avoid household activities that involve lifting, bending, pushing, or pulling such as laundry, vacuuming, grocery shopping, and childcare. Try to arrange for help from friends and family for these activities while your back heals.  Increase physical activity slowly as tolerated.  Taking short walks is encouraged, but avoid strenuous exercise. Do not jog, run, bicycle, lift weights, or participate in any other exercises unless specifically allowed by your doctor. Avoid prolonged sitting, including car rides.  Talk to your doctor before resuming sexual activity.  You should not drive until cleared by your doctor.  Until released by your doctor, you should not return to work or school.  You should rest at home and let your body heal.   You may shower three days after your surgery.  After showering, lightly dab your incision dry. Do not take a tub bath or go swimming for 3 weeks, or until approved by your doctor at your follow-up appointment.  If you smoke, we strongly recommend that you quit.  Smoking has been proven to interfere with normal healing in your back and will dramatically reduce the success rate of your surgery. Please contact [facility] (800-QUIT-NOW) and use the resources at www.[facility].com for  assistance in stopping smoking.  Surgical Incision   If you have a dressing on your incision, you may remove it three days after your surgery. Keep your incision area clean and dry.  If you have staples or stitches on your incision, you should have a follow up scheduled for removal. If you do not have staples or stitches, you will have steri-strips (small pieces of surgical tape) or Dermabond glue. The steri-strips/glue should begin to peel away within about a week (it is fine if the steri-strips fall off before then). If the strips are still in place one week after your surgery, you may gently remove them.  Diet            You may return to your usual diet. Be sure to stay hydrated.  When to Contact us  Although your surgery and recovery will likely be uneventful, you may have some residual numbness, aches, and pains in your back and/or legs. This is normal and should improve in the next few weeks.  However, should you experience any of the following, contact us immediately: New numbness or weakness Pain that is progressively getting worse, and is not relieved by your pain medications or rest Bleeding, redness, swelling, pain, or drainage from surgical incision Chills or flu-like symptoms Fever greater than 101.0 F (38.3 C) Problems with bowel or bladder functions Difficulty breathing or shortness of breath Warmth, tenderness, or swelling in your calf  Contact Information How to contact us:  If you have any questions/concerns before or after surgery, you can reach [place] at [phone], or you can send  a [employer]. We can be reached by phone or mychart 8am-4pm, Monday-Friday.  *Please note: Calls after 4pm are forwarded to a third party answering service. Mychart messages are not routinely monitored during evenings, weekends, and holidays. Please call our office to contact the answering service for urgent concerns during non-business hours.     AMBULATORY SURGERY  DISCHARGE  INSTRUCTIONS   The drugs that you were given will stay in your system until tomorrow so for the next 24 hours you should not:  Drive an automobile Make any legal decisions Drink any alcoholic beverage   You may resume regular meals tomorrow.  Today it is better to start with liquids and gradually work up to solid foods.  You may eat anything you prefer, but it is better to start with liquids, then soup and crackers, and gradually work up to solid foods.   Please notify your doctor immediately if you have any unusual bleeding, trouble breathing, redness and pain at the surgery site, drainage, fever, or pain not relieved by medication.   Additional Instructions:  leave green armband on for 4 days

## 2022-12-21 NOTE — Anesthesia Procedure Notes (Signed)
Procedure Name: Intubation Date/Time: 12/21/2022 7:55 AM  Performed by: Maryla Morrow., CRNAPre-anesthesia Checklist: Patient identified, Patient being monitored, Timeout performed, Emergency Drugs available and Suction available Patient Re-evaluated:Patient Re-evaluated prior to induction Oxygen Delivery Method: Circle system utilized Preoxygenation: Pre-oxygenation with 100% oxygen Induction Type: IV induction Ventilation: Mask ventilation without difficulty Laryngoscope Size: McGraph and 4 Grade View: Grade I Tube type: Oral Tube size: 7.5 mm Number of attempts: 1 Airway Equipment and Method: Stylet Placement Confirmation: ETT inserted through vocal cords under direct vision, positive ETCO2 and breath sounds checked- equal and bilateral Secured at: 21 cm Tube secured with: Tape Dental Injury: Teeth and Oropharynx as per pre-operative assessment

## 2022-12-21 NOTE — Discharge Summary (Addendum)
Discharge Summary  Patient ID: Johnny Liu MRN: [id number] DOB/AGE: [birth year] 57 y.o.  Admit date: 12/21/2022 Discharge date: 12/21/2022  Admission Diagnoses: Cervical radiculopathy and left upper extremity weakness Discharge Diagnoses:  Active Problems:   * No active hospital problems. *   Discharged Condition: good  Hospital Course:  Johnny Liu is a 57 year old presenting with left upper extremity weakness and corresponding cervical radiculopathy status post left C5-C6 posterior discectomy.  His intraoperative course was uncomplicated.  He was monitored in recovery and discharged home after ambulating, urinating, and tolerating p.o. intake.  He was given prescriptions for pain medication and muscle relaxer.  Consults: None  Significant Diagnostic Studies: none  Treatments: surgery: as above. Please see separately dictated operative report for further details.  Discharge Exam: Blood pressure (!) 151/92, pulse 93, temperature (!) 97 F (36.1 C), temperature source Temporal, resp. rate 16, height 5\' 8"  (1.727 m), weight 102.1 kg, SpO2 97%. CN II-XII grossly intact 5/5 throughout RUE. 4+ left bicep and triceps otherwise 5/5 LUE Incision c/d/I and covered with clean post-op dressing  Disposition: Discharge disposition: 01-Home or Self Care        Allergies as of 12/21/2022   No Known Allergies      Medication List     STOP taking these medications    losartan 50 MG tablet Commonly known as: COZAAR       TAKE these medications    acetaminophen 500 MG tablet Commonly known as: TYLENOL Take 1,000 mg by mouth every 6 (six) hours as needed.   cyclobenzaprine 5 MG tablet Commonly known as: FLEXERIL TAKE (1) TABLET BY MOUTH THREE TIMES A DAY AS NEEDED FOR MUSCLE SPASMS   ibuprofen 200 MG tablet Commonly known as: ADVIL Take 200 mg by mouth every 6 (six) hours as needed.   loratadine 10 MG tablet Commonly known as: CLARITIN Take 10 mg by mouth daily.    multivitamin tablet Take 1 tablet by mouth daily.   neomycin-polymyxin b-dexamethasone 3.5-10000-0.1 Susp Commonly known as: MAXITROL Place 2 drops into both eyes every 6 (six) hours.   oxyCODONE 5 MG immediate release tablet Commonly known as: Roxicodone Take 1 tablet (5 mg total) by mouth every 4 (four) hours as needed for up to 5 days for severe pain.   Semaglutide-Weight Management 0.5 MG/0.5ML Soaj Inject 0.5 mg into the skin once a week.   sildenafil 50 MG tablet Commonly known as: VIAGRA Take 1 tablet (50 mg total) by mouth daily as needed for erectile dysfunction.   testosterone cypionate 200 MG/ML injection Commonly known as: DEPOTESTOSTERONE CYPIONATE Inject into the muscle. urology         Signed: Susanne Borders 12/21/2022, 9:29 AM

## 2022-12-21 NOTE — Anesthesia Preprocedure Evaluation (Addendum)
Anesthesia Evaluation  Patient identified by MRN, date of birth, ID band Patient awake    Reviewed: Allergy & Precautions, H&P , NPO status , Patient's Chart, lab work & pertinent test results  Airway Mallampati: III  TM Distance: >3 FB Neck ROM: full    Dental  (+) Implants,    Pulmonary neg pulmonary ROS   Pulmonary exam normal        Cardiovascular hypertension, Normal cardiovascular exam     Neuro/Psych Cervical radiculopathy  Neuromuscular disease  negative psych ROS   GI/Hepatic Neg liver ROS,GERD  Controlled and Medicated,,  Endo/Other  negative endocrine ROS    Renal/GU      Musculoskeletal   Abdominal Normal abdominal exam  (+)   Peds  Hematology negative hematology ROS (+)   Anesthesia Other Findings Past Medical History: No date: Anxiety No date: Arthritis No date: Cervical radiculopathy No date: Chronic prostatitis No date: COVID-19 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 No date: GERD (gastroesophageal reflux disease) No date: Hypertension No date: Hypertrophy of prostate with urinary obstruction No date: Intervertebral cervical disc disorder with myelopathy,  cervical region No date: Problems with swallowing and mastication No date: Stricture and stenosis of esophagus  Past Surgical History: No date: APPENDECTOMY 08/31/2016: COLONOSCOPY WITH PROPOFOL; N/A     Comment:  Procedure: COLONOSCOPY WITH PROPOFOL;  Surgeon: Midge Minium, MD;  Location: [facility];  Service:               Endoscopy;  Laterality: N/A; 08/31/2016: ESOPHAGEAL DILATION     Comment:  Procedure: ESOPHAGEAL DILATION;  Surgeon: Midge Minium,               MD;  Location: [facility];  Service: Endoscopy;; 08/31/2016: ESOPHAGOGASTRODUODENOSCOPY (EGD) WITH PROPOFOL; N/A     Comment:  Procedure: ESOPHAGOGASTRODUODENOSCOPY (EGD) WITH               PROPOFOL;  Surgeon: Midge Minium, MD;   Location: [facility]               S[facility];  Service: Endoscopy;  Laterality: N/A; 2018: POLYPECTOMY  BMI    Body Mass Index: 34.21 kg/m      Reproductive/Obstetrics negative OB ROS                             Anesthesia Physical Anesthesia Plan  ASA: 2  Anesthesia Plan: General ETT   Post-op Pain Management: Ofirmev IV (intra-op)* and Toradol IV (intra-op)*   Induction: Intravenous  PONV Risk Score and Plan: 2 and Ondansetron, Dexamethasone and Midazolam  Airway Management Planned: Oral ETT  Additional Equipment:   Intra-op Plan:   Post-operative Plan: Extubation in OR  Informed Consent: I have reviewed the patients History and Physical, chart, labs and discussed the procedure including the risks, benefits and alternatives for the proposed anesthesia with the patient or authorized representative who has indicated his/her understanding and acceptance.     Dental Advisory Given  Plan Discussed with: CRNA and Surgeon  Anesthesia Plan Comments:         Anesthesia Quick Evaluation

## 2022-12-22 ENCOUNTER — Encounter: Payer: Self-pay | Admitting: Neurosurgery

## 2022-12-22 NOTE — Anesthesia Postprocedure Evaluation (Signed)
Anesthesia Post Note  Patient: Johnny Liu  Procedure(s) Performed: C5-6 POSTERIOR CERVICAL DISCECTOMY (Left: Neck)  Patient location during evaluation: PACU Anesthesia Type: General Level of consciousness: awake and alert Pain management: pain level controlled Vital Signs Assessment: post-procedure vital signs reviewed and stable Respiratory status: spontaneous breathing, nonlabored ventilation and respiratory function stable Cardiovascular status: blood pressure returned to baseline and stable Postop Assessment: no apparent nausea or vomiting Anesthetic complications: no   There were no known notable events for this encounter.   Last Vitals:  Vitals:   12/21/22 1100 12/21/22 1116  BP:  (!) 152/93  Pulse: 97 93  Resp: 17 16  Temp: 36.4 C (!) 36.4 C  SpO2: 95% 97%    Last Pain:  Vitals:   12/21/22 1116  TempSrc: Temporal  PainSc: 3                  Foye Deer

## 2022-12-31 NOTE — Progress Notes (Unsigned)
   REFERRING PHYSICIAN:  Duanne Limerick, Md [address] [place],  [place] [address]  DOS: 12/21/22 Left sided C5-6 posterior cervical discectomy   HISTORY OF PRESENT ILLNESS: Johnny Liu is 2 weeks status post above surgery. Given oxycocone on discharge from the hospital. He had flexeril to take as well.  He called the same day of surgery concerned about some bleeding from his incision. He met Dr. Katrinka Blazing in the office and his incision was stapled.   He is doing well. He has seen improvement in his neck pain and left arm pain. Still has some numbness in left arm. Notes some soreness in his neck as well. No drainage noted since day of surgery.   He is taking oxycodone prn. Needs a refill. Not taking much flexeril.    PHYSICAL EXAMINATION:  NEUROLOGICAL:  General: In no acute distress.   Awake, alert, oriented to person, place, and time.  Pupils equal round and reactive to light.  Facial tone is symmetric.    Strength: Side Biceps Triceps Deltoid Interossei Grip Wrist Ext. Wrist Flex.  R 5 5 5 5 5 5 5   L 4+ 4+ 5 5 5 5 5    Incision c/d/I, staples removed  Imaging:  Nothing new to review.   Assessment / Plan: Johnny Liu is doing well s/p above surgery. Treatment options reviewed with patient and following plan made:   - We discussed activity escalation and I have advised the patient to lift up to 10 pounds until 6 weeks after surgery (until follow up with Dr. Katrinka Blazing).   - Reviewed wound care.  - Continue current medications including prn oxycodone. Refill given. PMP reviewed and is appropriate.  - Follow up as scheduled in 4 weeks and prn.   BP was elevated. No symptoms of chest pain, shortness of breath, blurry vision, or headaches. He just had an energy drink 2 hours ago. He checks BP at home and it is usually normal. He will recheck once he gets home and call PCP if not improved. If he develops CP, SOB, blurry vision, or headaches, then he will go to ED.     Advised  to contact the office if any questions or concerns arise.   Johnny Leach PA-C Dept of Neurosurgery

## 2023-01-03 ENCOUNTER — Encounter: Payer: Self-pay | Admitting: Orthopedic Surgery

## 2023-01-03 ENCOUNTER — Ambulatory Visit (INDEPENDENT_AMBULATORY_CARE_PROVIDER_SITE_OTHER): Payer: BC Managed Care – PPO | Admitting: Orthopedic Surgery

## 2023-01-03 VITALS — BP 150/88 | Ht 68.0 in | Wt 225.0 lb

## 2023-01-03 DIAGNOSIS — Z981 Arthrodesis status: Secondary | ICD-10-CM

## 2023-01-03 DIAGNOSIS — M5412 Radiculopathy, cervical region: Secondary | ICD-10-CM

## 2023-01-03 DIAGNOSIS — M50022 Cervical disc disorder at C5-C6 level with myelopathy: Secondary | ICD-10-CM

## 2023-01-03 DIAGNOSIS — Z09 Encounter for follow-up examination after completed treatment for conditions other than malignant neoplasm: Secondary | ICD-10-CM

## 2023-01-03 MED ORDER — OXYCODONE HCL 5 MG PO TABS
5.0000 mg | ORAL_TABLET | Freq: Four times a day (QID) | ORAL | 0 refills | Status: DC | PRN
Start: 2023-01-03 — End: 2023-01-31

## 2023-01-03 NOTE — Progress Notes (Signed)
Patient came back to clinic today due to some leakage from his wound.  Appear to be mostly blood-tinged.  No evidence of CSF leak.  States that when he was at home he was moving around slightly and felt some wetness on the back of his neck.  Tried dressing it which did not cause any stoppage of the bleeding.  Came into the clinic setting was found to have a small pinhole area which had some slow but steady ooze.  Given the findings we placed 2 staples at this level in a sterile position.  We then placed a sterile dressing.  Overall he was doing well from surgery other than this issue with his incision.  Will like to see him again for follow-up and staple removal.

## 2023-01-03 NOTE — Patient Instructions (Signed)
Your blood pressure was elevated today. I want you to recheck it at home and follow up with your PCP if it remains high. If you have any chest pain, shortness of breath, blurry vision, or headaches then you need to go to ED.    Take care,   Kennyth Arnold

## 2023-01-08 ENCOUNTER — Encounter: Payer: Self-pay | Admitting: Internal Medicine

## 2023-01-08 ENCOUNTER — Other Ambulatory Visit: Payer: Self-pay | Admitting: Internal Medicine

## 2023-01-08 DIAGNOSIS — H10023 Other mucopurulent conjunctivitis, bilateral: Secondary | ICD-10-CM

## 2023-01-08 MED ORDER — ERYTHROMYCIN 5 MG/GM OP OINT
1.0000 | TOPICAL_OINTMENT | Freq: Every day | OPHTHALMIC | 0 refills | Status: DC
Start: 2023-01-08 — End: 2023-04-26

## 2023-01-31 ENCOUNTER — Encounter: Payer: Self-pay | Admitting: Neurosurgery

## 2023-01-31 ENCOUNTER — Ambulatory Visit (INDEPENDENT_AMBULATORY_CARE_PROVIDER_SITE_OTHER): Payer: BC Managed Care – PPO | Admitting: Neurosurgery

## 2023-01-31 VITALS — BP 138/88 | Ht 68.0 in | Wt 225.0 lb

## 2023-01-31 DIAGNOSIS — Z09 Encounter for follow-up examination after completed treatment for conditions other than malignant neoplasm: Secondary | ICD-10-CM

## 2023-01-31 DIAGNOSIS — M5000 Cervical disc disorder with myelopathy, unspecified cervical region: Secondary | ICD-10-CM

## 2023-01-31 NOTE — Progress Notes (Signed)
   DOS: 12/21/2022  HISTORY OF PRESENT ILLNESS: 01/31/2023 [name] is status post left-sided C5-6 posterior cervical discectomy.  Overall he has been doing very well.  Had a significant improvement in his arm pain weakness and numbness and tingling.  He is having postoperative neck pain and soreness as expected.  He does feel that this is slowly improving but does have some periods where he notices catches when turning his head in certain positions.  PHYSICAL EXAMINATION:   Vitals:   01/31/23 1436  BP: 138/88   General: Patient is well developed, well nourished, calm, collected, and in no apparent distress.  NEUROLOGICAL:  General: In no acute distress.  Awake, alert, oriented to person, place, and time. Pupils equal round and reactive to light.   Strength: Significant improvement noted in his left upper extremity function.  At least 4+ out of 5 in his elbow flexion, extension, and wrist extension.  Incision c/d/i   ROS (Neurologic): Negative except as noted above  IMAGING: No interval imaging to review   ASSESSMENT/PLAN:  Johnny Liu is doing well after his posterior cervical discectomy.  This was done approximately 6 weeks ago.  Overall he is doing well.  Not having any new neurologic deficits.  He states that his numbness, pain, and weakness have all improved significantly.  He is having expected postoperative neck soreness.  He states that he notes when he has certain positions that he feels some pulling sensations in his neck.  Given that he is only 6 weeks out we feel that this is likely expected postoperatively and that he will should continue to improve.  We let him know that he can ice, heat, and use NSAID medications to help with his pain.  Overall he feels that he is doing well.  He continues to slowly increase his physical functioning.    Lovenia Kim, MD/MSCR Department of Neurosurgery

## 2023-03-14 ENCOUNTER — Encounter: Payer: BC Managed Care – PPO | Admitting: Orthopedic Surgery

## 2023-03-21 NOTE — Progress Notes (Deleted)
   REFERRING PHYSICIAN:  Duanne Limerick, Md [address] [place],  [place] [address]  DOS: 12/21/22 Left sided C5-6 posterior cervical discectomy   HISTORY OF PRESENT ILLNESS:  He was doing well at his last visit.      He called the same day of surgery concerned about some bleeding from his incision. He met Dr. Katrinka Blazing in the office and his incision was stapled.   He is doing well. He has seen improvement in his neck pain and left arm pain. Still has some numbness in left arm. Notes some soreness in his neck as well. No drainage noted since day of surgery.   He is taking oxycodone prn. Needs a refill. Not taking much flexeril.    PHYSICAL EXAMINATION:  NEUROLOGICAL:  General: In no acute distress.   Awake, alert, oriented to person, place, and time.  Pupils equal round and reactive to light.  Facial tone is symmetric.    Strength: Side Biceps Triceps Deltoid Interossei Grip Wrist Ext. Wrist Flex.  R 5 5 5 5 5 5 5   L 4+*** 4+*** 5 5 5  4+*** 5   Incision well healed  Imaging:  Nothing new to review.   Assessment / Plan: AUNDREA DEMMONS is doing well s/p above surgery. Treatment options reviewed with patient and following plan made:   - We discussed activity escalation and I have advised the patient to lift up to 10 pounds until 6 weeks after surgery (until follow up with Dr. Katrinka Blazing).   - Reviewed wound care.  - Continue current medications including prn oxycodone. Refill given. PMP reviewed and is appropriate.  - Follow up as scheduled in 4 weeks and prn.   Advised to contact the office if any questions or concerns arise.   Drake Leach PA-C Dept of Neurosurgery

## 2023-03-22 ENCOUNTER — Encounter: Payer: BC Managed Care – PPO | Admitting: Orthopedic Surgery

## 2023-04-09 ENCOUNTER — Encounter: Payer: Self-pay | Admitting: Family Medicine

## 2023-04-09 ENCOUNTER — Ambulatory Visit: Payer: BC Managed Care – PPO | Admitting: Family Medicine

## 2023-04-09 VITALS — BP 120/77 | HR 94 | Temp 97.4°F | Ht 68.0 in | Wt 225.0 lb

## 2023-04-09 DIAGNOSIS — J0100 Acute maxillary sinusitis, unspecified: Secondary | ICD-10-CM

## 2023-04-09 DIAGNOSIS — R051 Acute cough: Secondary | ICD-10-CM

## 2023-04-09 MED ORDER — PROMETHAZINE-DM 6.25-15 MG/5ML PO SYRP
5.0000 mL | ORAL_SOLUTION | Freq: Four times a day (QID) | ORAL | 0 refills | Status: DC | PRN
Start: 2023-04-09 — End: 2023-04-26

## 2023-04-09 MED ORDER — PREDNISONE 10 MG PO TABS
10.0000 mg | ORAL_TABLET | Freq: Every day | ORAL | 0 refills | Status: DC
Start: 2023-04-09 — End: 2023-04-26

## 2023-04-09 MED ORDER — AZITHROMYCIN 250 MG PO TABS
ORAL_TABLET | ORAL | 0 refills | Status: AC
Start: 2023-04-09 — End: 2023-04-14

## 2023-04-09 NOTE — Progress Notes (Signed)
 Date:  04/09/2023   Name:  BETTIE SWAVELY   DOB:  December 09, 1965   MRN:  [id number]   Chief Complaint: Sinus Problem (Pt states him and wife had symptoms started around 12/25. Pt state he is having a lot of mucus and congestion and his symptoms are not getting better.)  Sinusitis This is a new problem. The current episode started 1 to 4 weeks ago. The problem has been gradually improving since onset. The pain is moderate. Associated symptoms include chills, congestion, coughing, ear pain, a hoarse voice, sinus pressure and a sore throat. Pertinent negatives include no diaphoresis, headaches, neck pain, shortness of breath, sneezing or swollen glands. Treatments tried: otc/dayquil/saline. The treatment provided no relief.    Lab Results  Component Value Date   NA 136 12/13/2022   K 4.0 12/13/2022   CO2 26 12/13/2022   GLUCOSE 75 12/13/2022   BUN 17 12/13/2022   CREATININE 1.14 12/13/2022   CALCIUM 8.9 12/13/2022   EGFR 73 05/31/2022   GFRNONAA >60 12/13/2022   Lab Results  Component Value Date   CHOL 144 05/31/2022   HDL 53 01/17/2021   LDLCALC 84 01/17/2021   TRIG 57 05/31/2022   CHOLHDL 3.2 12/21/2015   Lab Results  Component Value Date   TSH 1.16 05/31/2022   Lab Results  Component Value Date   HGBA1C 5.6 01/17/2021   Lab Results  Component Value Date   WBC 7.6 12/13/2022   HGB 16.6 12/13/2022   HCT 48.0 12/13/2022   MCV 87.9 12/13/2022   PLT 213 12/13/2022   Lab Results  Component Value Date   ALT 46 (A) 05/31/2022   AST 35 05/31/2022   ALKPHOS 53 05/31/2022   BILITOT 0.2 10/02/2019   Lab Results  Component Value Date   VD25OH 31.1 05/31/2022     Review of Systems  Constitutional:  Positive for chills and fever. Negative for diaphoresis.  HENT:  Positive for congestion, ear pain, hoarse voice, postnasal drip, rhinorrhea, sinus pressure and sore throat. Negative for nosebleeds and sneezing.   Respiratory:  Positive for cough. Negative for shortness of  breath and wheezing.   Cardiovascular:  Negative for chest pain, palpitations and leg swelling.  Gastrointestinal:  Negative for abdominal distention.  Musculoskeletal:  Negative for neck pain.  Neurological:  Negative for headaches.    Patient Active Problem List   Diagnosis Date Noted  . Intervertebral cervical disc disorder with myelopathy, cervical region 11/29/2022  . Cervicalgia 11/15/2022  . Cervical radiculopathy 11/15/2022  . Heartburn   . Problems with swallowing and mastication   . Gastritis without bleeding   . Stricture and stenosis of esophagus   . Abnormal feces   . Benign neoplasm of cecum   . Polyp of sigmoid colon   . Chronic prostatitis 09/23/2012  . Hypertrophy of prostate with urinary obstruction and other lower urinary tract symptoms (LUTS) 09/23/2012  . 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 Other specified disorder of male genital organs(608.89) 09/23/2012    No Known Allergies  Past Surgical History:  Procedure Laterality Date  . APPENDECTOMY    . COLONOSCOPY WITH PROPOFOL  N/A 08/31/2016   Procedure: COLONOSCOPY WITH PROPOFOL ;  Surgeon: Jinny Carmine, MD;  Location: [facility];  Service: Endoscopy;  Laterality: N/A;  . ESOPHAGEAL DILATION  08/31/2016   Procedure: ESOPHAGEAL DILATION;  Surgeon: Jinny Carmine, MD;  Location: [facility];  Service: Endoscopy;;  .  ESOPHAGOGASTRODUODENOSCOPY (EGD) WITH PROPOFOL  N/A 08/31/2016   Procedure: ESOPHAGOGASTRODUODENOSCOPY (EGD) WITH PROPOFOL ;  Surgeon: Jinny Carmine, MD;  Location: [facility];  Service: Endoscopy;  Laterality: N/A;  . POLYPECTOMY  2018  . POSTERIOR CERVICAL LAMINECTOMY Left 12/21/2022   Procedure: C5-6 POSTERIOR CERVICAL DISCECTOMY;  Surgeon: Claudene Penne ORN, MD;  Location: [facility];  Service: Neurosurgery;  Laterality: Left;    Social History   Tobacco Use  . Smoking status: Never  . Smokeless  tobacco: Never  Vaping Use  . Vaping status: Never Used  Substance Use Topics  . Alcohol use: Yes    Alcohol/week: 7.0 standard drinks of alcohol    Types: 7 Cans of beer per week  . Drug use: No     Medication list has been reviewed and updated.  Current Meds  Medication Sig  . acetaminophen  (TYLENOL ) 500 MG tablet Take 1,000 mg by mouth every 6 (six) hours as needed.  . erythromycin  ophthalmic ointment Place 1 Application into the left eye at bedtime.  . ibuprofen  (ADVIL ) 200 MG tablet Take 200 mg by mouth every 6 (six) hours as needed.  . loratadine (CLARITIN) 10 MG tablet Take 10 mg by mouth daily.  . Multiple Vitamin (MULTIVITAMIN) tablet Take 1 tablet by mouth daily.  . neomycin -polymyxin b-dexamethasone  (MAXITROL) 3.5-10000-0.1 SUSP Place 2 drops into both eyes every 6 (six) hours.  . Semaglutide-Weight Management 0.5 MG/0.5ML SOAJ Inject 0.5 mg into the skin once a week.  . sildenafil  (VIAGRA ) 50 MG tablet Take 1 tablet (50 mg total) by mouth daily as needed for erectile dysfunction.  . testosterone  cypionate (DEPOTESTOSTERONE CYPIONATE) 200 MG/ML injection Inject into the muscle. urology       12/12/2022   10:21 AM 07/21/2021   10:09 AM 09/23/2020   10:55 AM 03/18/2020    9:52 AM  GAD 7 : Generalized Anxiety Score  Nervous, Anxious, on Edge 0 0 0 0  Control/stop worrying 0 0 0 0  Worry too much - different things 0 0 0 0  Trouble relaxing 0 0 0 0  Restless 0 0 0 0  Easily annoyed or irritable 0 0 0 0  Afraid - awful might happen 0 0 0 0  Total GAD 7 Score 0 0 0 0  Anxiety Difficulty Not difficult at all Not difficult at all         04/09/2023   11:05 AM 12/12/2022   10:21 AM 07/21/2021   10:09 AM  Depression screen PHQ 2/9  Decreased Interest 0 0 0  Down, Depressed, Hopeless 2 0 0  PHQ - 2 Score 2 0 0  Altered sleeping 2 0 0  Tired, decreased energy 0 0 0  Change in appetite 0 0 0  Feeling bad or failure about yourself  2 0 0  Trouble concentrating 2 0 0   Moving slowly or fidgety/restless 0 0 0  Suicidal thoughts 0 0 0  PHQ-9 Score 8 0 0  Difficult doing work/chores Somewhat difficult Not difficult at all Not difficult at all    BP Readings from Last 3 Encounters:  04/09/23 120/77  01/31/23 138/88  01/03/23 (!) 150/88    Physical Exam Vitals and nursing note reviewed.  HENT:     Head: Normocephalic.     Jaw: There is normal jaw occlusion.     Right Ear: Hearing, tympanic membrane, ear canal and external ear normal.     Left Ear: Hearing, tympanic membrane, ear canal and external ear normal.  Nose: No congestion or rhinorrhea.     Right Turbinates: Swollen. Not enlarged.     Left Turbinates: Swollen. Not enlarged.     Right Sinus: Maxillary sinus tenderness present. No frontal sinus tenderness.     Left Sinus: Maxillary sinus tenderness present. No frontal sinus tenderness.     Mouth/Throat:     Lips: Pink.     Mouth: Mucous membranes are moist.     Dentition: Normal dentition. No gingival swelling, dental caries or gum lesions.     Tongue: No lesions.     Palate: No mass and lesions.     Pharynx: Oropharynx is clear. Uvula midline. No pharyngeal swelling, oropharyngeal exudate, posterior oropharyngeal erythema, uvula swelling or postnasal drip.     Tonsils: No tonsillar exudate or tonsillar abscesses.  Eyes:     General: No scleral icterus.       Right eye: No discharge.        Left eye: No discharge.     Conjunctiva/sclera: Conjunctivae normal.     Pupils: Pupils are equal, round, and reactive to light.  Neck:     Thyroid : No thyromegaly.     Vascular: No JVD.     Trachea: No tracheal deviation.  Cardiovascular:     Rate and Rhythm: Normal rate and regular rhythm.     Heart sounds: Normal heart sounds. No murmur heard.    No friction rub. No gallop.  Pulmonary:     Effort: No respiratory distress.     Breath sounds: Normal breath sounds. No wheezing, rhonchi or rales.  Abdominal:     General: Bowel sounds are  normal.     Palpations: Abdomen is soft. There is no mass.     Tenderness: There is no abdominal tenderness. There is no guarding or rebound.  Musculoskeletal:        General: No tenderness. Normal range of motion.     Cervical back: Normal range of motion and neck supple.  Lymphadenopathy:     Cervical: No cervical adenopathy.  Skin:    General: Skin is warm.     Findings: No rash.  Neurological:     Mental Status: He is alert and oriented to person, place, and time.     Cranial Nerves: No cranial nerve deficit.     Deep Tendon Reflexes: Reflexes are normal and symmetric.    Wt Readings from Last 3 Encounters:  04/09/23 225 lb (102.1 kg)  01/31/23 225 lb (102.1 kg)  01/03/23 225 lb (102.1 kg)    BP 120/77   Pulse 94   Temp (!) 97.4 F (36.3 C)   Ht 5' 8 (1.727 m)   Wt 225 lb (102.1 kg)   SpO2 96%   BMI 34.21 kg/m   Assessment and Plan:  1. Acute non-recurrent maxillary sinusitis (Primary) New onset.  Persistent.  Stable.  Patient's been symptomatic since around Christmas time.  This producing purulent nasal discharge for the past several days with low-grade fever.  This is unlikely COVID given the duration we will treat for sinusitis more likely with past success use azithromycin  to 50 mg 2 today followed by 1 a day for 4 days and prednisone  10 mg 2 tablets a day for 1 week then 1 a day. - azithromycin  (ZITHROMAX ) 250 MG tablet; Take 2 tablets on day 1, then 1 tablet daily on days 2 through 5  Dispense: 6 tablet; Refill: 0 - predniSONE  (DELTASONE ) 10 MG tablet; Take 1 tablet (10 mg total) by mouth  daily with breakfast.  Dispense: 30 tablet; Refill: 0  2. Acute cough Patient with acute cough that is persistent may use Sinex DM during the day and patient has been given promethazine  dextromethorphan at night.  Will recheck patient on as-needed basis. - promethazine -dextromethorphan (PROMETHAZINE -DM) 6.25-15 MG/5ML syrup; Take 5 mLs by mouth 4 (four) times daily as needed.   Dispense: 118 mL; Refill: 0    Cathryne Molt, MD

## 2023-04-12 ENCOUNTER — Ambulatory Visit: Payer: Self-pay | Admitting: *Deleted

## 2023-04-12 NOTE — Telephone Encounter (Signed)
 Summary: pt is worse   Pt states  he is getting worse, feels like he has walking pna, would like stronger abx, and cough medicine  pt states he has severe chest congestion..         Reason for Disposition . [1] Taking antibiotic > 72 hours (3 days) AND [2] sinus pain not improved  Answer Assessment - Initial Assessment Questions 1. ANTIBIOTIC: What antibiotic are you taking? How many times a day?     Azithromycin  250 MG Take 2 tablets on day 1, then 1 tablet daily on days 2 through 5 predniSONE  10 mg Oral Daily with breakfast Promethazine -DM 6.25-15 MG/5ML 5 mLs Oral 4 times daily PRN  2. ONSET: When was the antibiotic started?     Monday 3. PAIN: How bad is the sinus pain?   (Scale 1-10; mild, moderate or severe)   - MILD (1-3): doesn't interfere with normal activities    - MODERATE (4-7): interferes with normal activities (e.g., work or school) or awakens from sleep   - SEVERE (8-10): excruciating pain and patient unable to do any normal activities        moderate 4. FEVER: Do you have a fever? If Yes, ask: What is it, how was it measured, and when did it start?      flushing 5. SYMPTOMS: Are there any other symptoms you're concerned about? If Yes, ask: When did it start?     Respiratory symptoms- chest congestion, unable to sleep, using saline- but immediately comes back, ears popping, eyes running  Protocols used: Sinus Infection on Antibiotic Follow-up Call-[facility]

## 2023-04-12 NOTE — Telephone Encounter (Signed)
 Patient informed to continue the current treatment course per Dr Yetta Barre.

## 2023-04-12 NOTE — Telephone Encounter (Signed)
  Chief Complaint: OV 04/09/23- patient feels he needs longer treatment- requesting stronger antibiotic Symptoms: cough- chest congestion, nasal congestion- thick mucus production- copious amounts. Ear pressure, eyes running Frequency: started antibiotic 04/08/22- thought he may be improving 1 day- but he states not better Pertinent Negatives: Patient denies fever- feels flush at times Disposition: [] ED /[] Urgent Care (no appt availability in office) / [] Appointment(In office/virtual)/ []  [facility]/ [] Home Care/ [x] Refused Recommended Disposition /[]  Mobile Bus/ []  Follow-up with PCP Additional Notes: Offered MyChart visit for follow up- but patient states he would prefer next step- longer treatment- please call and let him know.

## 2023-04-13 NOTE — Progress Notes (Signed)
   REFERRING PHYSICIAN:  Joshua Cathryne BROCKS, Md [address] [place],  [place] [address]  DOS: 12/21/22 Left sided C5-6 posterior cervical discectomy   HISTORY OF PRESENT ILLNESS:  He was doing well at his last visit.   No neck or arm pain. He has a little numbness in his finger tips on left. Feels like left arm strength is back to normal.    He is on prednisone  for his sinuses.   PHYSICAL EXAMINATION:  NEUROLOGICAL:  General: In no acute distress.   Awake, alert, oriented to person, place, and time.  Pupils equal round and reactive to light.  Facial tone is symmetric.    Strength: Side Biceps Triceps Deltoid Interossei Grip Wrist Ext. Wrist Flex.  R 5 5 5 5 5 5 5   L 5 5 5 5 5 5 5    Incision well healed  Imaging:  Nothing new to review.   Assessment / Plan: Johnny Liu is doing well s/p above surgery. Treatment options reviewed with patient and following plan made:   - He can slowly return to activity as tolerated.  - Can return to gym. Will go slow and let pain be his guide.  - Can get back to golf. Start with putting and progress slowly.  - He will f/u prn.   BP was slightly elevated.  No symptoms of chest pain, shortness of breath, blurry vision, or headaches. He checks BP at home and it generally runs lower. Will recheck at home and call PCP if not improved. If he develops CP, SOB, blurry vision, or headaches, then he will go to ED.   Discussed rechecking BP today and he declined.   Advised to contact the office if any questions or concerns arise.   I spent a total of 10 minutes in face-to-face and non-face-to-face activities related to this patient's care today including review of outside records, review of imaging, review of symptoms, physical exam, discussion of differential diagnosis, discussion of treatment options, and documentation.   Glade Boys PA-C Dept of Neurosurgery

## 2023-04-17 ENCOUNTER — Ambulatory Visit (INDEPENDENT_AMBULATORY_CARE_PROVIDER_SITE_OTHER): Payer: BC Managed Care – PPO | Admitting: Orthopedic Surgery

## 2023-04-17 ENCOUNTER — Encounter: Payer: Self-pay | Admitting: Orthopedic Surgery

## 2023-04-17 VITALS — BP 142/90 | Ht 68.0 in | Wt 225.0 lb

## 2023-04-17 DIAGNOSIS — M5000 Cervical disc disorder with myelopathy, unspecified cervical region: Secondary | ICD-10-CM | POA: Diagnosis not present

## 2023-04-17 DIAGNOSIS — Z9889 Other specified postprocedural states: Secondary | ICD-10-CM

## 2023-04-17 DIAGNOSIS — M5412 Radiculopathy, cervical region: Secondary | ICD-10-CM

## 2023-04-26 ENCOUNTER — Ambulatory Visit (INDEPENDENT_AMBULATORY_CARE_PROVIDER_SITE_OTHER): Payer: BC Managed Care – PPO | Admitting: Family Medicine

## 2023-04-26 ENCOUNTER — Encounter: Payer: Self-pay | Admitting: Family Medicine

## 2023-04-26 ENCOUNTER — Telehealth: Payer: Self-pay

## 2023-04-26 VITALS — BP 150/100 | HR 108 | Ht 68.0 in | Wt 233.4 lb

## 2023-04-26 DIAGNOSIS — J4521 Mild intermittent asthma with (acute) exacerbation: Secondary | ICD-10-CM

## 2023-04-26 DIAGNOSIS — J0100 Acute maxillary sinusitis, unspecified: Secondary | ICD-10-CM | POA: Diagnosis not present

## 2023-04-26 MED ORDER — AMOXICILLIN-POT CLAVULANATE 875-125 MG PO TABS
1.0000 | ORAL_TABLET | Freq: Two times a day (BID) | ORAL | 0 refills | Status: DC
Start: 2023-04-26 — End: 2023-07-31

## 2023-04-26 MED ORDER — ALBUTEROL SULFATE HFA 108 (90 BASE) MCG/ACT IN AERS
2.0000 | INHALATION_SPRAY | Freq: Four times a day (QID) | RESPIRATORY_TRACT | 2 refills | Status: AC | PRN
Start: 2023-04-26 — End: ?

## 2023-04-26 MED ORDER — MONTELUKAST SODIUM 10 MG PO TABS
10.0000 mg | ORAL_TABLET | Freq: Every day | ORAL | 3 refills | Status: AC
Start: 2023-04-26 — End: ?

## 2023-04-26 NOTE — Telephone Encounter (Signed)
Patient was seen a few weeks ago. Called the after hours center last night complaining of shortness of breath and still having sinus infection. Patient needs to come in if no better.  Please call patient to schedule a follow up with Dr Yetta Barre to discuss illness.  Thank you.

## 2023-04-26 NOTE — Progress Notes (Signed)
Date:  04/26/2023   Name:  Johnny Liu   DOB:  [birth year]   MRN:  [id number]   Chief Complaint: Sinusitis (Patient comes in for lingering cough and green mucus/ congestion in his nose. )  Sinusitis This is a new problem. The current episode started 1 to 4 weeks ago. The problem has been waxing and waning (improved and plateaued) since onset. There has been no fever. The pain is moderate. Associated symptoms include congestion, coughing, ear pain, headaches, a hoarse voice, shortness of breath, sinus pressure, sneezing and a sore throat. Pertinent negatives include no chills, diaphoresis, neck pain or swollen glands. (Frontal headache) The treatment provided moderate relief.    Lab Results  Component Value Date   NA 136 12/13/2022   K 4.0 12/13/2022   CO2 26 12/13/2022   GLUCOSE 75 12/13/2022   BUN 17 12/13/2022   CREATININE 1.14 12/13/2022   CALCIUM 8.9 12/13/2022   EGFR 73 05/31/2022   GFRNONAA >60 12/13/2022   Lab Results  Component Value Date   CHOL 144 05/31/2022   HDL 53 01/17/2021   LDLCALC 84 01/17/2021   TRIG 57 05/31/2022   CHOLHDL 3.2 12/21/2015   Lab Results  Component Value Date   TSH 1.16 05/31/2022   Lab Results  Component Value Date   HGBA1C 5.6 01/17/2021   Lab Results  Component Value Date   WBC 7.6 12/13/2022   HGB 16.6 12/13/2022   HCT 48.0 12/13/2022   MCV 87.9 12/13/2022   PLT 213 12/13/2022   Lab Results  Component Value Date   ALT 46 (A) 05/31/2022   AST 35 05/31/2022   ALKPHOS 53 05/31/2022   BILITOT 0.2 10/02/2019   Lab Results  Component Value Date   VD25OH 31.1 05/31/2022     Review of Systems  Constitutional:  Negative for chills and diaphoresis.  HENT:  Positive for congestion, ear pain, hoarse voice, sinus pressure, sneezing and sore throat. Negative for postnasal drip and rhinorrhea.   Respiratory:  Positive for cough and shortness of breath. Negative for apnea, choking, chest tightness and wheezing.   Cardiovascular:   Negative for chest pain, palpitations and leg swelling.  Gastrointestinal:  Negative for abdominal distention, abdominal pain and anal bleeding.  Musculoskeletal:  Negative for neck pain.  Neurological:  Positive for headaches.    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   Other specified disorder of male genital organs(608.89) 09/23/2012    No Known Allergies  Past Surgical History:  Procedure Laterality Date   APPENDECTOMY     COLONOSCOPY WITH PROPOFOL N/A 08/31/2016   Procedure: COLONOSCOPY WITH PROPOFOL;  Surgeon: Midge Minium, MD;  Location: [facility];  Service: Endoscopy;  Laterality: N/A;   ESOPHAGEAL DILATION  08/31/2016   Procedure: ESOPHAGEAL DILATION;  Surgeon: Midge Minium, MD;  Location: [facility];  Service: Endoscopy;;   ESOPHAGOGASTRODUODENOSCOPY (  EGD) WITH PROPOFOL N/A 08/31/2016   Procedure: ESOPHAGOGASTRODUODENOSCOPY (EGD) WITH PROPOFOL;  Surgeon: Midge Minium, MD;  Location: [facility];  Service: Endoscopy;  Laterality: N/A;   POLYPECTOMY  2018   POSTERIOR CERVICAL LAMINECTOMY Left 12/21/2022   Procedure: C5-6 POSTERIOR CERVICAL DISCECTOMY;  Surgeon: Lovenia Kim, MD;  Location: [facility];  Service: Neurosurgery;  Laterality: Left;    Social History   Tobacco Use   Smoking status: Never   Smokeless tobacco: Never  Vaping Use    Vaping status: Never Used  Substance Use Topics   Alcohol use: Yes    Alcohol/week: 7.0 standard drinks of alcohol    Types: 7 Cans of beer per week   Drug use: No     Medication list has been reviewed and updated.  Current Meds  Medication Sig   acetaminophen (TYLENOL) 500 MG tablet Take 1,000 mg by mouth every 6 (six) hours as needed.   ibuprofen (ADVIL) 200 MG tablet Take 200 mg by mouth every 6 (six) hours as needed.   loratadine (CLARITIN) 10 MG tablet Take 10 mg by mouth daily.   Multiple Vitamin (MULTIVITAMIN) tablet Take 1 tablet by mouth daily.   testosterone cypionate (DEPOTESTOSTERONE CYPIONATE) 200 MG/ML injection Inject into the muscle. urology       12/12/2022   10:21 AM 07/21/2021   10:09 AM 09/23/2020   10:55 AM 03/18/2020    9:52 AM  GAD 7 : Generalized Anxiety Score  Nervous, Anxious, on Edge 0 0 0 0  Control/stop worrying 0 0 0 0  Worry too much - different things 0 0 0 0  Trouble relaxing 0 0 0 0  Restless 0 0 0 0  Easily annoyed or irritable 0 0 0 0  Afraid - awful might happen 0 0 0 0  Total GAD 7 Score 0 0 0 0  Anxiety Difficulty Not difficult at all Not difficult at all         04/09/2023   11:05 AM 12/12/2022   10:21 AM 07/21/2021   10:09 AM  Depression screen PHQ 2/9  Decreased Interest 0 0 0  Down, Depressed, Hopeless 2 0 0  PHQ - 2 Score 2 0 0  Altered sleeping 2 0 0  Tired, decreased energy 0 0 0  Change in appetite 0 0 0  Feeling bad or failure about yourself  2 0 0  Trouble concentrating 2 0 0  Moving slowly or fidgety/restless 0 0 0  Suicidal thoughts 0 0 0  PHQ-9 Score 8 0 0  Difficult doing work/chores Somewhat difficult Not difficult at all Not difficult at all    BP Readings from Last 3 Encounters:  04/26/23 (!) 150/100  04/17/23 (!) 142/90  04/09/23 120/77    Physical Exam Vitals and nursing note reviewed.  Constitutional:      Appearance: He is well-developed.  HENT:     Head: Normocephalic and atraumatic.     Right  Ear: Tympanic membrane, ear canal and external ear normal.     Left Ear: Tympanic membrane, ear canal and external ear normal.     Nose: Nose normal. No congestion or rhinorrhea.     Right Turbinates: Swollen.     Left Turbinates: Swollen.     Mouth/Throat:     Mouth: Mucous membranes are moist.     Dentition: Normal dentition.     Tongue: No lesions.     Pharynx: No pharyngeal swelling, posterior oropharyngeal erythema or postnasal drip.  Tonsils: No tonsillar exudate or tonsillar abscesses.  Eyes:     General: Lids are normal. No scleral icterus.    Conjunctiva/sclera: Conjunctivae normal.     Pupils: Pupils are equal, round, and reactive to light.  Neck:     Thyroid: No thyromegaly.     Vascular: No carotid bruit, hepatojugular reflux or JVD.     Trachea: No tracheal deviation.  Cardiovascular:     Rate and Rhythm: Normal rate and regular rhythm.     Heart sounds: Normal heart sounds. No murmur heard.    No gallop.  Pulmonary:     Effort: Pulmonary effort is normal.     Breath sounds: Normal breath sounds. No stridor. No wheezing, rhonchi or rales.  Abdominal:     General: Bowel sounds are normal.     Palpations: Abdomen is soft. There is no hepatomegaly, splenomegaly or mass.     Tenderness: There is no abdominal tenderness.     Hernia: There is no hernia in the left inguinal area.  Musculoskeletal:        General: Normal range of motion.     Cervical back: Normal range of motion and neck supple.  Lymphadenopathy:     Cervical: No cervical adenopathy.  Skin:    General: Skin is warm and dry.     Findings: No rash.  Neurological:     Mental Status: He is alert and oriented to person, place, and time.     Sensory: No sensory deficit.     Deep Tendon Reflexes: Reflexes are normal and symmetric.  Psychiatric:        Mood and Affect: Mood is not anxious or depressed.     Wt Readings from Last 3 Encounters:  04/26/23 233 lb 6.4 oz (105.9 kg)  04/17/23 225 lb (102.1  kg)  04/09/23 225 lb (102.1 kg)    BP (!) 150/100   Pulse (!) 108   Ht 5\' 8"  (1.727 m)   Wt 233 lb 6.4 oz (105.9 kg)   SpO2 96%   BMI 35.49 kg/m   Assessment and Plan: 1. Acute non-recurrent maxillary sinusitis (Primary) New onset set.  Persistent.  Relatively stable and that he is improved but has not cleared.  Will initiate Augmentin 875 mg twice a day.  Will also begin Singulair and that there is a reactive airway component as well.  Will schedule for sinus x-rays but patient is going to have to return tomorrow but since the opportunity has closed after 5:00. - montelukast (SINGULAIR) 10 MG tablet; Take 1 tablet (10 mg total) by mouth at bedtime.  Dispense: 30 tablet; Refill: 3 - amoxicillin-clavulanate (AUGMENTIN) 875-125 MG tablet; Take 1 tablet by mouth 2 (two) times daily.  Dispense: 20 tablet; Refill: 0 - DG Sinuses Complete; Future  2. Mild intermittent reactive airway disease with acute exacerbation Chronic.  Episodic.  No wheezes noted but there is slight increased expiratory to inspiratory ratio.  Will initiate Singulair and start albuterol inhaler 1 to 2 puffs every 6 hours. - albuterol (VENTOLIN HFA) 108 (90 Base) MCG/ACT inhaler; Inhale 2 puffs into the lungs every 6 (six) hours as needed for wheezing or shortness of breath.  Dispense: 8 g; Refill: 2 - montelukast (SINGULAIR) 10 MG tablet; Take 1 tablet (10 mg total) by mouth at bedtime.  Dispense: 30 tablet; Refill: 3     Elizabeth Sauer, MD

## 2023-07-17 IMAGING — CR DG KNEE 1-2V*L*
2 series · 2 of 2 positions shown · non-contrast
Comparison: None.

CLINICAL DATA: Palpable abnormality in the left knee for 1 week, no
known injury, initial encounter

EXAM:
LEFT KNEE - 2 VIEW

[knee ap]
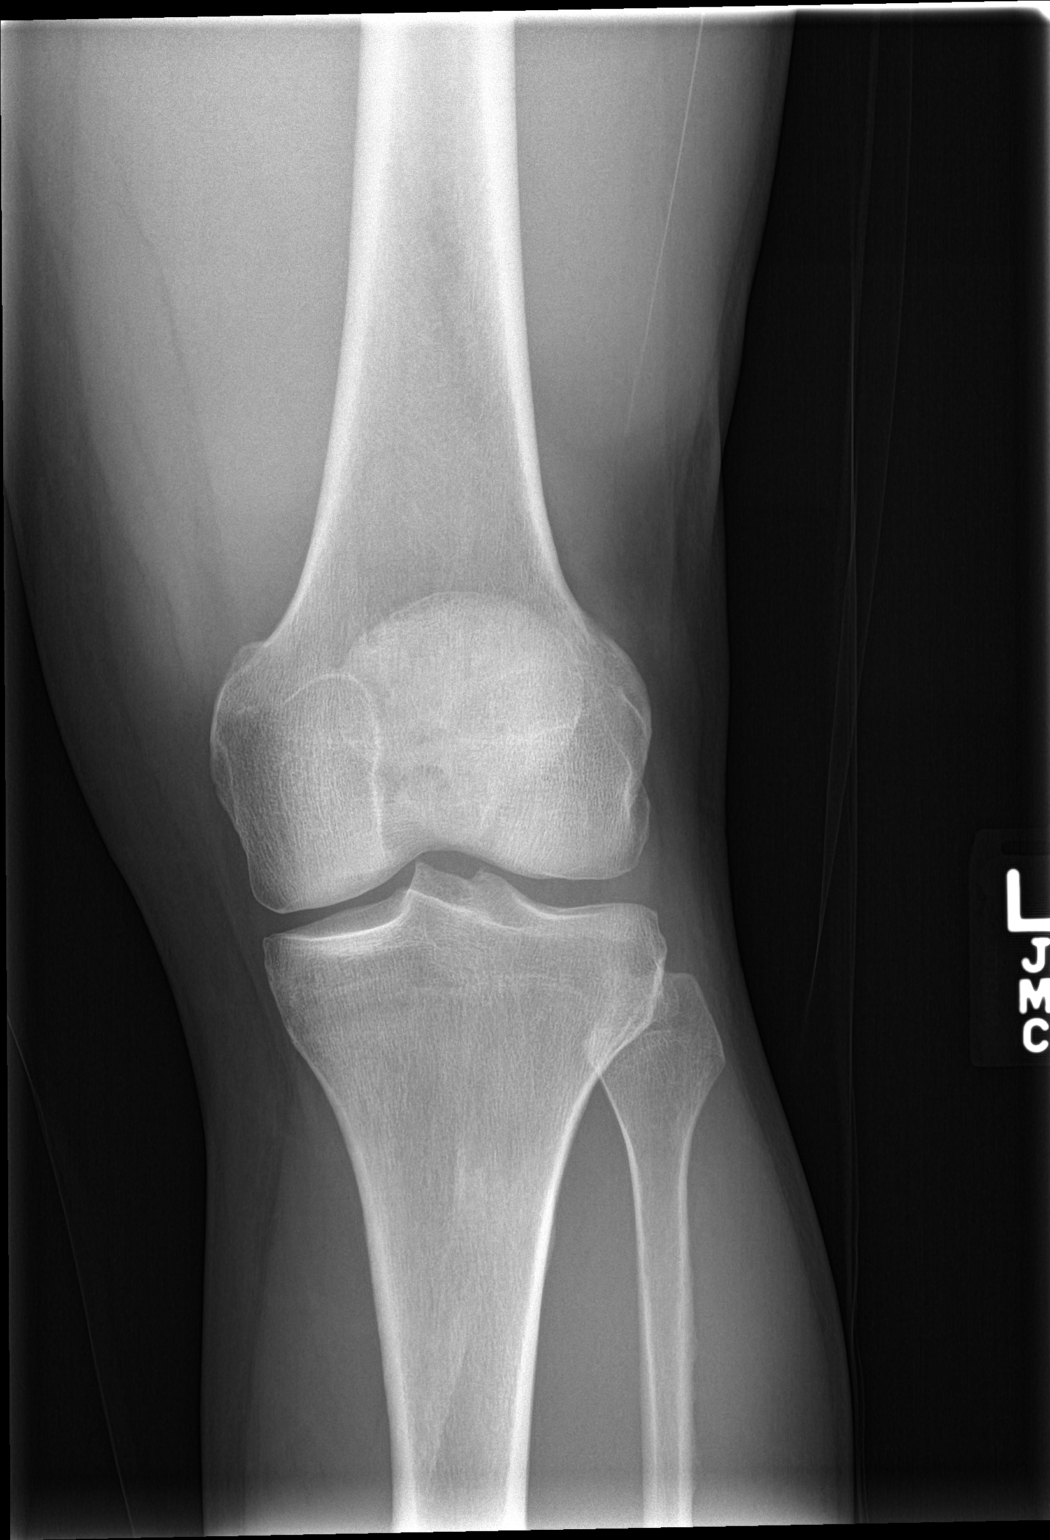

[knee lat]
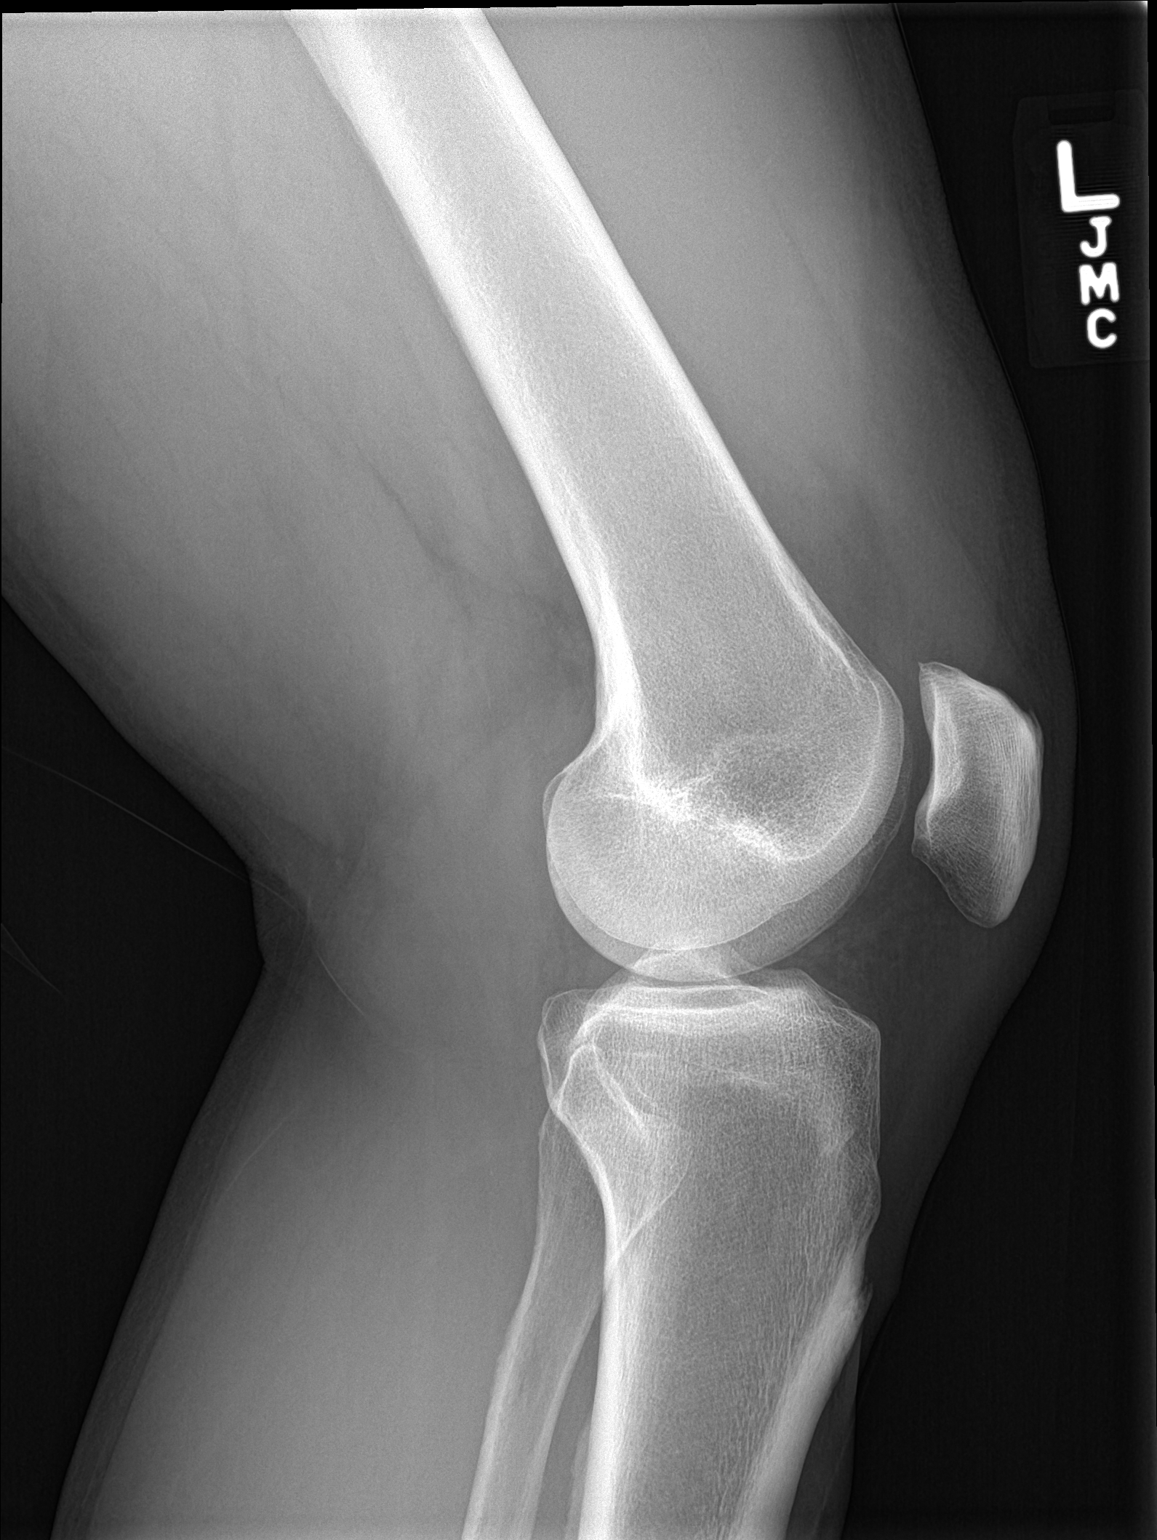

[2 of 2 positions shown; findings below may reference images not displayed]

FINDINGS: No acute fracture or dislocation is noted. No joint effusion is
seen. No soft tissue changes are noted.
IMPRESSION: No acute abnormality noted.

## 2023-07-31 ENCOUNTER — Ambulatory Visit (INDEPENDENT_AMBULATORY_CARE_PROVIDER_SITE_OTHER): Admitting: Family Medicine

## 2023-07-31 ENCOUNTER — Encounter: Payer: Self-pay | Admitting: Family Medicine

## 2023-07-31 VITALS — BP 144/96 | HR 72 | Ht 68.0 in | Wt 233.0 lb

## 2023-07-31 DIAGNOSIS — M25562 Pain in left knee: Secondary | ICD-10-CM

## 2023-07-31 DIAGNOSIS — N411 Chronic prostatitis: Secondary | ICD-10-CM

## 2023-07-31 DIAGNOSIS — M25561 Pain in right knee: Secondary | ICD-10-CM

## 2023-07-31 DIAGNOSIS — R69 Illness, unspecified: Secondary | ICD-10-CM | POA: Diagnosis not present

## 2023-07-31 DIAGNOSIS — I10 Essential (primary) hypertension: Secondary | ICD-10-CM

## 2023-07-31 DIAGNOSIS — Z0000 Encounter for general adult medical examination without abnormal findings: Secondary | ICD-10-CM | POA: Diagnosis not present

## 2023-07-31 MED ORDER — MELOXICAM 15 MG PO TABS
15.0000 mg | ORAL_TABLET | Freq: Every day | ORAL | 2 refills | Status: AC
Start: 2023-07-31 — End: ?

## 2023-07-31 MED ORDER — LOSARTAN POTASSIUM 25 MG PO TABS
25.0000 mg | ORAL_TABLET | Freq: Every day | ORAL | 1 refills | Status: AC
Start: 2023-07-31 — End: ?

## 2023-07-31 NOTE — Progress Notes (Signed)
 Date:  07/31/2023   Name:  Johnny Liu   DOB:  11-10-1965   MRN:  [id number]   Chief Complaint: Annual Exam (Patient presents today for his annual exam. He is doing well today and he would like to discuss sleep issues and weight management, and focus issues.)  HPI  Lab Results  Component Value Date   NA 136 12/13/2022   K 4.0 12/13/2022   CO2 26 12/13/2022   GLUCOSE 75 12/13/2022   BUN 17 12/13/2022   CREATININE 1.14 12/13/2022   CALCIUM 8.9 12/13/2022   EGFR 73 05/31/2022   GFRNONAA >60 12/13/2022   Lab Results  Component Value Date   CHOL 144 05/31/2022   HDL 53 01/17/2021   LDLCALC 84 01/17/2021   TRIG 57 05/31/2022   CHOLHDL 3.2 12/21/2015   Lab Results  Component Value Date   TSH 1.16 05/31/2022   Lab Results  Component Value Date   HGBA1C 5.6 01/17/2021   Lab Results  Component Value Date   WBC 7.6 12/13/2022   HGB 16.6 12/13/2022   HCT 48.0 12/13/2022   MCV 87.9 12/13/2022   PLT 213 12/13/2022   Lab Results  Component Value Date   ALT 46 (A) 05/31/2022   AST 35 05/31/2022   ALKPHOS 53 05/31/2022   BILITOT 0.2 10/02/2019   Lab Results  Component Value Date   VD25OH 31.1 05/31/2022     Review of Systems  Constitutional:  Negative for chills and fever.  HENT:  Negative for drooling, ear discharge, ear pain and sore throat.   Respiratory:  Negative for cough, shortness of breath and wheezing.   Cardiovascular:  Negative for chest pain, palpitations and leg swelling.  Gastrointestinal:  Negative for abdominal pain, blood in stool, constipation, diarrhea and nausea.  Endocrine: Negative for polydipsia.  Genitourinary:  Negative for dysuria, frequency, hematuria and urgency.  Musculoskeletal:  Negative for back pain, myalgias and neck pain.  Skin:  Negative for rash.  Allergic/Immunologic: Negative for environmental allergies.  Neurological:  Negative for dizziness and headaches.  Hematological:  Does not bruise/bleed easily.   Psychiatric/Behavioral:  Negative for suicidal ideas. The patient is not nervous/anxious.     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   Other specified disorder of male genital organs(608.89) 09/23/2012    No Known Allergies  Past Surgical History:  Procedure Laterality Date   APPENDECTOMY     COLONOSCOPY WITH PROPOFOL  N/A 08/31/2016   Procedure: COLONOSCOPY WITH PROPOFOL ;  Surgeon: Marnee Sink, MD;  Location: [facility];  Service: Endoscopy;  Laterality: N/A;   ESOPHAGEAL DILATION  08/31/2016   Procedure: ESOPHAGEAL DILATION;  Surgeon: Marnee Sink, MD;  Location: [facility];  Service: Endoscopy;;   ESOPHAGOGASTRODUODENOSCOPY (EGD) WITH PROPOFOL  N/A 08/31/2016   Procedure: ESOPHAGOGASTRODUODENOSCOPY (EGD) WITH PROPOFOL ;  Surgeon: Marnee Sink, MD;  Location: [facility];  Service:  Endoscopy;  Laterality: N/A;   POLYPECTOMY  2018   POSTERIOR CERVICAL LAMINECTOMY Left 12/21/2022   Procedure: C5-6 POSTERIOR CERVICAL DISCECTOMY;  Surgeon: Carroll Clamp, MD;  Location: [facility];  Service: Neurosurgery;  Laterality: Left;    Social History   Tobacco Use   Smoking status: Never   Smokeless tobacco: Never  Vaping Use   Vaping status: Never Used  Substance Use Topics   Alcohol use: Yes    Alcohol/week: 7.0 standard drinks of alcohol    Types: 7 Cans of  beer per week   Drug use: No     Medication list has been reviewed and updated.  Current Meds  Medication Sig   acetaminophen  (TYLENOL ) 500 MG tablet Take 1,000 mg by mouth every 6 (six) hours as needed.   albuterol  (VENTOLIN  HFA) 108 (90 Base) MCG/ACT inhaler Inhale 2 puffs into the lungs every 6 (six) hours as needed for wheezing or shortness of breath.   ibuprofen  (ADVIL ) 200 MG tablet Take 200 mg by mouth every 6 (six) hours as needed.   loratadine (CLARITIN) 10 MG tablet Take 10 mg by mouth daily.   Multiple Vitamin (MULTIVITAMIN) tablet Take 1 tablet by mouth daily.   sildenafil  (VIAGRA ) 50 MG tablet Take 1 tablet (50 mg total) by mouth daily as needed for erectile dysfunction.   testosterone  cypionate (DEPOTESTOSTERONE CYPIONATE) 200 MG/ML injection Inject into the muscle. urology       07/31/2023   10:04 AM 12/12/2022   10:21 AM 07/21/2021   10:09 AM 09/23/2020   10:55 AM  GAD 7 : Generalized Anxiety Score  Nervous, Anxious, on Edge 3 0 0 0  Control/stop worrying 3 0 0 0  Worry too much - different things 3 0 0 0  Trouble relaxing 3 0 0 0  Restless 3 0 0 0  Easily annoyed or irritable 3 0 0 0  Afraid - awful might happen 1 0 0 0  Total GAD 7 Score 19 0 0 0  Anxiety Difficulty Extremely difficult Not difficult at all Not difficult at all        07/31/2023   10:04 AM 04/09/2023   11:05 AM 12/12/2022   10:21 AM  Depression screen PHQ 2/9  Decreased Interest 2 0 0  Down, Depressed, Hopeless 2 2 0  PHQ - 2 Score 4 2 0  Altered sleeping 3 2 0  Tired, decreased energy 3 0 0  Change in appetite 3 0 0  Feeling bad or failure about yourself  1 2 0  Trouble concentrating 3 2 0  Moving slowly or fidgety/restless 3 0 0  Suicidal thoughts 0 0 0  PHQ-9 Score 20 8 0  Difficult doing work/chores Extremely dIfficult Somewhat difficult Not difficult at all    BP Readings from Last 3 Encounters:  07/31/23 (!) 144/96  04/26/23 (!) 150/100  04/17/23 (!) 142/90    Physical  Exam Vitals and nursing note reviewed.  Constitutional:      Appearance: He is well-developed.  HENT:     Head: Normocephalic and atraumatic.     Right Ear: Tympanic membrane, ear canal and external ear normal.     Left Ear: Tympanic membrane, ear canal and external ear normal.     Nose: Nose normal.     Mouth/Throat:     Mouth: Mucous membranes are moist.     Dentition: Normal dentition.  Eyes:     General: Lids are normal. No scleral icterus.    Conjunctiva/sclera: Conjunctivae  normal.     Pupils: Pupils are equal, round, and reactive to light.  Neck:     Thyroid : No thyromegaly.     Vascular: No carotid bruit, hepatojugular reflux or JVD.     Trachea: No tracheal deviation.  Cardiovascular:     Rate and Rhythm: Normal rate and regular rhythm.     Heart sounds: Normal heart sounds.  Pulmonary:     Effort: Pulmonary effort is normal.     Breath sounds: Normal breath sounds. No wheezing, rhonchi or rales.  Abdominal:     General: Bowel sounds are normal.     Palpations: Abdomen is soft. There is no hepatomegaly, splenomegaly or mass.     Tenderness: There is no abdominal tenderness.     Hernia: There is no hernia in the left inguinal area.  Musculoskeletal:        General: Normal range of motion.     Cervical back: Normal range of motion and neck supple.  Lymphadenopathy:     Cervical: No cervical adenopathy.  Skin:    General: Skin is warm and dry.     Findings: No rash.  Neurological:     Mental Status: He is alert and oriented to person, place, and time.     Sensory: No sensory deficit.     Deep Tendon Reflexes: Reflexes are normal and symmetric.  Psychiatric:        Mood and Affect: Mood is not anxious or depressed.     Wt Readings from Last 3 Encounters:  07/31/23 233 lb (105.7 kg)  04/26/23 233 lb 6.4 oz (105.9 kg)  04/17/23 225 lb (102.1 kg)    BP (!) 144/96 (BP Location: Left Arm, Cuff Size: Large)   Pulse 72   Ht 5\' 8"  (1.727 m)   Wt 233 lb (105.7 kg)    SpO2 99%   BMI 35.43 kg/m   Assessment and Plan: 1. Annual physical exam (Primary) No subjective/objective concerns noted on history of present illness.,  Review of past medical history/review of medications/review of last 6 months of lab work. - TSH + free T4 - Comprehensive metabolic panel with GFR - Lipid Panel With LDL/HDL Ratio - CBC with Differential/Platelet - PSA  2. Primary hypertension Chronic.  Controlled.  Stable.  Needs - losartan  (COZAAR ) 25 MG tablet; Take 1 tablet (25 mg total) by mouth daily.  Dispense: 90 tablet; Refill: 1  3. Arthralgia of both knees Chronic.  Controlled.  Stable.  Patient is having difficulty with knee discomfort and we will resume his meloxicam 15 mg - meloxicam (MOBIC) 15 MG tablet; Take 1 tablet (15 mg total) by mouth daily.  Dispense: 30 tablet; Refill: 2  4. Taking medication for chronic disease Patient is taking medication for chronic disease and will provide see by checking a CBC to make sure there is no upper lower GU open I believe that may be contributing to an anemia  5. Chronic prostatitis Patient has a history of chronic prostatitis.  DRE is normal with normal size shape and consistency of the prostate we will check PSA for current level measured. - PSA     Alayne Allis, MD

## 2023-08-02 DIAGNOSIS — N411 Chronic prostatitis: Secondary | ICD-10-CM | POA: Diagnosis not present

## 2023-08-02 DIAGNOSIS — Z0000 Encounter for general adult medical examination without abnormal findings: Secondary | ICD-10-CM | POA: Diagnosis not present

## 2023-08-03 ENCOUNTER — Encounter: Payer: Self-pay | Admitting: Family Medicine

## 2023-08-03 LAB — CBC WITH DIFFERENTIAL/PLATELET
Basophils Absolute: 0 10*3/uL (ref 0.0–0.2)
Basos: 1 %
EOS (ABSOLUTE): 0.2 10*3/uL (ref 0.0–0.4)
Eos: 2 %
Hematocrit: 50.4 % (ref 37.5–51.0)
Hemoglobin: 17 g/dL (ref 13.0–17.7)
Immature Grans (Abs): 0 10*3/uL (ref 0.0–0.1)
Immature Granulocytes: 0 %
Lymphocytes Absolute: 2.2 10*3/uL (ref 0.7–3.1)
Lymphs: 30 %
MCH: 30.9 pg (ref 26.6–33.0)
MCHC: 33.7 g/dL (ref 31.5–35.7)
MCV: 92 fL (ref 79–97)
Monocytes Absolute: 0.7 10*3/uL (ref 0.1–0.9)
Monocytes: 9 %
Neutrophils Absolute: 4.2 10*3/uL (ref 1.4–7.0)
Neutrophils: 58 %
Platelets: 195 10*3/uL (ref 150–450)
RBC: 5.5 x10E6/uL (ref 4.14–5.80)
RDW: 12.9 % (ref 11.6–15.4)
WBC: 7.3 10*3/uL (ref 3.4–10.8)

## 2023-08-03 LAB — PSA: Prostate Specific Ag, Serum: 1.1 ng/mL (ref 0.0–4.0)

## 2023-08-03 LAB — COMPREHENSIVE METABOLIC PANEL WITH GFR
ALT: 48 IU/L — ABNORMAL HIGH (ref 0–44)
AST: 32 IU/L (ref 0–40)
Albumin: 5.1 g/dL — ABNORMAL HIGH (ref 3.8–4.9)
Alkaline Phosphatase: 57 IU/L (ref 44–121)
BUN/Creatinine Ratio: 15 (ref 9–20)
BUN: 15 mg/dL (ref 6–24)
Bilirubin Total: 0.7 mg/dL (ref 0.0–1.2)
CO2: 23 mmol/L (ref 20–29)
Calcium: 9.1 mg/dL (ref 8.7–10.2)
Chloride: 100 mmol/L (ref 96–106)
Creatinine, Ser: 1.02 mg/dL (ref 0.76–1.27)
Globulin, Total: 2.6 g/dL (ref 1.5–4.5)
Glucose: 100 mg/dL — ABNORMAL HIGH (ref 70–99)
Potassium: 4.3 mmol/L (ref 3.5–5.2)
Sodium: 138 mmol/L (ref 134–144)
Total Protein: 7.7 g/dL (ref 6.0–8.5)
eGFR: 85 mL/min/{1.73_m2} (ref >59)

## 2023-08-03 LAB — TSH+FREE T4
Free T4: 1.21 ng/dL (ref 0.82–1.77)
TSH: 1.26 u[IU]/mL (ref 0.450–4.500)

## 2023-08-03 LAB — LIPID PANEL WITH LDL/HDL RATIO
Cholesterol, Total: 161 mg/dL (ref 100–199)
HDL: 54 mg/dL (ref >39)
LDL Chol Calc (NIH): 94 mg/dL (ref 0–99)
LDL/HDL Ratio: 1.7 ratio (ref 0.0–3.6)
Triglycerides: 64 mg/dL (ref 0–149)
VLDL Cholesterol Cal: 13 mg/dL (ref 5–40)

## 2023-08-21 DIAGNOSIS — E299 Testicular dysfunction, unspecified: Secondary | ICD-10-CM | POA: Diagnosis not present

## 2023-08-21 DIAGNOSIS — R7989 Other specified abnormal findings of blood chemistry: Secondary | ICD-10-CM | POA: Diagnosis not present

## 2023-08-21 DIAGNOSIS — R5383 Other fatigue: Secondary | ICD-10-CM | POA: Diagnosis not present

## 2023-08-24 DIAGNOSIS — M25561 Pain in right knee: Secondary | ICD-10-CM | POA: Diagnosis not present

## 2023-08-24 DIAGNOSIS — M25562 Pain in left knee: Secondary | ICD-10-CM | POA: Diagnosis not present

## 2023-08-29 DIAGNOSIS — E291 Testicular hypofunction: Secondary | ICD-10-CM | POA: Diagnosis not present

## 2023-08-29 DIAGNOSIS — N529 Male erectile dysfunction, unspecified: Secondary | ICD-10-CM | POA: Diagnosis not present

## 2023-08-29 DIAGNOSIS — Z125 Encounter for screening for malignant neoplasm of prostate: Secondary | ICD-10-CM | POA: Diagnosis not present

## 2023-09-07 DIAGNOSIS — M2242 Chondromalacia patellae, left knee: Secondary | ICD-10-CM | POA: Diagnosis not present

## 2023-09-07 DIAGNOSIS — M7652 Patellar tendinitis, left knee: Secondary | ICD-10-CM | POA: Diagnosis not present

## 2023-09-12 ENCOUNTER — Ambulatory Visit: Admitting: Physician Assistant

## 2023-09-12 ENCOUNTER — Encounter: Payer: Self-pay | Admitting: Physician Assistant

## 2023-09-12 VITALS — BP 130/80 | HR 87 | Temp 98.1°F | Ht 68.0 in | Wt 218.0 lb

## 2023-09-12 DIAGNOSIS — G8929 Other chronic pain: Secondary | ICD-10-CM | POA: Insufficient documentation

## 2023-09-12 DIAGNOSIS — R41840 Attention and concentration deficit: Secondary | ICD-10-CM

## 2023-09-12 DIAGNOSIS — Z860101 Personal history of adenomatous and serrated colon polyps: Secondary | ICD-10-CM

## 2023-09-12 DIAGNOSIS — Z85828 Personal history of other malignant neoplasm of skin: Secondary | ICD-10-CM | POA: Insufficient documentation

## 2023-09-12 DIAGNOSIS — R7989 Other specified abnormal findings of blood chemistry: Secondary | ICD-10-CM | POA: Diagnosis not present

## 2023-09-12 DIAGNOSIS — M25562 Pain in left knee: Secondary | ICD-10-CM

## 2023-09-12 DIAGNOSIS — Z7985 Long-term (current) use of injectable non-insulin antidiabetic drugs: Secondary | ICD-10-CM | POA: Insufficient documentation

## 2023-09-12 DIAGNOSIS — M25561 Pain in right knee: Secondary | ICD-10-CM

## 2023-09-12 DIAGNOSIS — I10 Essential (primary) hypertension: Secondary | ICD-10-CM | POA: Insufficient documentation

## 2023-09-12 MED ORDER — DICLOFENAC SODIUM 1 % EX GEL
2.0000 g | Freq: Four times a day (QID) | CUTANEOUS | 1 refills | Status: AC
Start: 2023-09-12 — End: ?

## 2023-09-12 MED ORDER — BUPROPION HCL ER (SR) 150 MG PO TB12
150.0000 mg | ORAL_TABLET | Freq: Every day | ORAL | 1 refills | Status: DC
Start: 2023-09-12 — End: 2023-11-16

## 2023-09-12 NOTE — Assessment & Plan Note (Signed)
 Elevated x 2 in clinic today, consistent with prior measurements over the last 6 months at least.  Continue taking losartan  at the current dose and work on lifestyle changes as discussed today.  Emphasized the importance of home blood pressure monitoring, encouraged to log for my review next time.  Reviewed the Seven S's of hypertension including salt, smoking, stimulants (e.g. caffeine), stress, sleep, snoring (OSA), sedentary lifestyle.

## 2023-09-12 NOTE — Progress Notes (Signed)
 Date:  09/12/2023   Name:  Johnny Liu   DOB:  [birth year]   MRN:  [id number]   Chief Complaint: Hypertension, Anxiety (Feels overwhelmed at times, hard to focus), and Medical Management of Chronic Issues (On a diet and trying to lose weight wants to talk about his diet )  HPI Johnny Liu is a pleasant 58 year old male with a history of BPH with LUTS, prior skin cancer (melanoma and BCC), chronic knee pain, and chronic cervicalgia who presents new to me today for transfer of care, previously seen by my colleague Dr. Alayne Allis who has now retired.  He complains of persistent knee pain, using meloxicam  and acetaminophen  for this, starting PT soon.  He has not used topical Voltaren to his knowledge.  Patient overdue for repeat colonoscopy since 2023 for surveillance due to history of adenomatous polyp in 2018.  Last one was done by Dr. Ole Berkeley.   Also sees [facility] in [place] who prescribes compounded semaglutide 2.5 mg weekly for the last 2 months for weight management.  He does not have diabetes.  They also did some recent labs for him with Ferritin >800. No known family history of hereditary hemochromatosis or other iron storage disorder.    HTN historically not very well-controlled.  Immediately prior to today's visit, he reports consuming a 22 ounce coffee, so admits blood pressure may be elevated today.  Does not reliably monitor at home.  Reports good compliance with medication.  Endorses a stressful job, frequent loss of focus, feeling overwhelmed with some symptoms of anxiety and depression.  Does not want to use controlled substance/stimulant.  Medication list has been reviewed and updated.  Current Meds  Medication Sig   acetaminophen  (TYLENOL ) 500 MG tablet Take 1,000 mg by mouth every 6 (six) hours as needed.   albuterol  (VENTOLIN  HFA) 108 (90 Base) MCG/ACT inhaler Inhale 2 puffs into the lungs every 6 (six) hours as needed for wheezing or shortness of breath.   buPROPion  (WELLBUTRIN SR) 150 MG 12 hr tablet Take 1 tablet (150 mg total) by mouth daily.   diclofenac Sodium (VOLTAREN) 1 % GEL Apply 2 g topically 4 (four) times daily. Use on affected joint up to 4x/day as needed. 2 grams is roughly 4 fingertips' worth of gel.   ibuprofen  (ADVIL ) 200 MG tablet Take 200 mg by mouth every 6 (six) hours as needed.   loratadine (CLARITIN) 10 MG tablet Take 10 mg by mouth daily as needed.   losartan  (COZAAR ) 25 MG tablet Take 1 tablet (25 mg total) by mouth daily.   MAGNESIUM PO Take by mouth.   meloxicam  (MOBIC ) 15 MG tablet Take 1 tablet (15 mg total) by mouth daily.   montelukast  (SINGULAIR ) 10 MG tablet Take 1 tablet (10 mg total) by mouth at bedtime. (Patient taking differently: Take 10 mg by mouth as needed.)   Multiple Vitamin (MULTIVITAMIN) tablet Take 1 tablet by mouth daily.   Needle, Disp, (HYPODERMIC NEEDLE 18GX1) 18G X 1 MISC Use as directed for testosterone   withdrawal   sildenafil  (VIAGRA ) 50 MG tablet Take 1 tablet (50 mg total) by mouth daily as needed for erectile dysfunction.   SYRINGE-NEEDLE, DISP, 3 ML (B-D 3CC LUER-LOK SYR 23GX1-1/2) 23G X 1-1/2 3 ML MISC Use as directed for testosterone  injections   tadalafil (CIALIS) 5 MG tablet Take 5 mg by mouth as needed.   testosterone  cypionate (DEPOTESTOSTERONE CYPIONATE) 200 MG/ML injection Inject into the muscle. urology   VITAMIN D  PO Take  by mouth.     Review of Systems  Patient Active Problem List   Diagnosis Date Noted   History of skin cancer 09/12/2023   Elevated ferritin 09/12/2023   Bilateral chronic knee pain 09/12/2023   Long-term current use of injectable noninsulin antidiabetic medication 09/12/2023   Primary hypertension 09/12/2023   Intervertebral cervical disc disorder with myelopathy, cervical region 11/29/2022   Cervicalgia 11/15/2022   Cervical radiculopathy 11/15/2022   Heartburn    History of esophageal stricture    History of adenomatous polyp of colon    Hypertrophy of  prostate with urinary obstruction and other lower urinary tract symptoms (LUTS) 09/23/2012   ED (erectile dysfunction) of organic origin 09/23/2012    No Known Allergies  Immunization History  Administered Date(s) Administered   Influenza,inj,Quad PF,6+ Mos 12/27/2012, 12/31/2017, 03/18/2020, 01/14/2021   PFIZER(Purple Top)SARS-COV-2 Vaccination 10/03/2019, 10/25/2019    Past Surgical History:  Procedure Laterality Date   APPENDECTOMY     COLONOSCOPY WITH PROPOFOL  N/A 08/31/2016   Procedure: COLONOSCOPY WITH PROPOFOL ;  Surgeon: Marnee Sink, MD;  Location: [facility];  Service: Endoscopy;  Laterality: N/A;   ESOPHAGEAL DILATION  08/31/2016   Procedure: ESOPHAGEAL DILATION;  Surgeon: Marnee Sink, MD;  Location: [facility];  Service: Endoscopy;;   ESOPHAGOGASTRODUODENOSCOPY (EGD) WITH PROPOFOL  N/A 08/31/2016   Procedure: ESOPHAGOGASTRODUODENOSCOPY (EGD) WITH PROPOFOL ;  Surgeon: Marnee Sink, MD;  Location: [facility];  Service: Endoscopy;  Laterality: N/A;   POLYPECTOMY  2018   POSTERIOR CERVICAL LAMINECTOMY Left 12/21/2022   Procedure: C5-6 POSTERIOR CERVICAL DISCECTOMY;  Surgeon: Carroll Clamp, MD;  Location: [facility];  Service: Neurosurgery;  Laterality: Left;    Social History   Tobacco Use   Smoking status: Never   Smokeless tobacco: Never  Vaping Use   Vaping status: Never Used  Substance Use Topics   Alcohol use: Yes    Alcohol/week: 7.0 standard drinks of alcohol    Types: 7 Cans of beer per week   Drug use: No    Family History  Problem Relation Age of Onset   Healthy Mother    Healthy Father         09/12/2023    9:14 AM 07/31/2023   10:04 AM 12/12/2022   10:21 AM 07/21/2021   10:09 AM  GAD 7 : Generalized Anxiety Score  Nervous, Anxious, on Edge 2 3 0 0  Control/stop worrying 3 3 0 0  Worry too much - different things 2 3 0 0  Trouble relaxing 2 3 0 0  Restless 2 3 0 0  Easily annoyed or irritable 1 3 0 0  Afraid - awful  might happen 1 1 0 0  Total GAD 7 Score 13 19 0 0  Anxiety Difficulty Somewhat difficult Extremely difficult Not difficult at all Not difficult at all       09/12/2023    9:13 AM 07/31/2023   10:04 AM 04/09/2023   11:05 AM  Depression screen PHQ 2/9  Decreased Interest 0 2 0  Down, Depressed, Hopeless 1 2 2   PHQ - 2 Score 1 4 2   Altered sleeping 1 3 2   Tired, decreased energy 2 3 0  Change in appetite 0 3 0  Feeling bad or failure about yourself  0 1 2  Trouble concentrating 3 3 2   Moving slowly or fidgety/restless 2 3 0  Suicidal thoughts 0 0 0  PHQ-9 Score 9 20 8   Difficult doing work/chores Somewhat difficult Extremely dIfficult Somewhat difficult  BP Readings from Last 3 Encounters:  09/12/23 130/80  07/31/23 (!) 144/96  04/26/23 (!) 150/100    Wt Readings from Last 3 Encounters:  09/12/23 218 lb (98.9 kg)  07/31/23 233 lb (105.7 kg)  04/26/23 233 lb 6.4 oz (105.9 kg)    BP 130/80 (Cuff Size: Large)   Pulse 87   Temp 98.1 F (36.7 C)   Ht 5' 8 (1.727 m)   Wt 218 lb (98.9 kg)   SpO2 96%   BMI 33.15 kg/m   Physical Exam Vitals and nursing note reviewed.  Constitutional:      Appearance: Normal appearance.  Cardiovascular:     Rate and Rhythm: Normal rate.  Pulmonary:     Effort: Pulmonary effort is normal.  Abdominal:     General: There is no distension.  Musculoskeletal:        General: Normal range of motion.  Skin:    General: Skin is warm and dry.  Neurological:     Mental Status: He is alert and oriented to person, place, and time.     Gait: Gait is intact.  Psychiatric:        Mood and Affect: Mood and affect normal.     Recent Labs     Component Value Date/Time   NA 138 08/02/2023 1034   K 4.3 08/02/2023 1034   CL 100 08/02/2023 1034   CO2 23 08/02/2023 1034   GLUCOSE 100 (H) 08/02/2023 1034   GLUCOSE 75 12/13/2022 0956   BUN 15 08/02/2023 1034   CREATININE 1.02 08/02/2023 1034   CALCIUM 9.1 08/02/2023 1034   PROT 7.7 08/02/2023  1034   ALBUMIN 5.1 (H) 08/02/2023 1034   AST 32 08/02/2023 1034   ALT 48 (H) 08/02/2023 1034   ALKPHOS 57 08/02/2023 1034   BILITOT 0.7 08/02/2023 1034   GFRNONAA >60 12/13/2022 0956   GFRAA 86 10/02/2019 0828    Lab Results  Component Value Date   WBC 7.3 08/02/2023   HGB 17.0 08/02/2023   HCT 50.4 08/02/2023   MCV 92 08/02/2023   PLT 195 08/02/2023   Lab Results  Component Value Date   HGBA1C 5.6 01/17/2021   Lab Results  Component Value Date   CHOL 161 08/02/2023   HDL 54 08/02/2023   LDLCALC 94 08/02/2023   TRIG 64 08/02/2023   CHOLHDL 3.2 12/21/2015   Lab Results  Component Value Date   TSH 1.260 08/02/2023     Assessment and Plan:  Primary hypertension Assessment & Plan: Elevated x 2 in clinic today, consistent with prior measurements over the last 6 months at least.  Continue taking losartan  at the current dose and work on lifestyle changes as discussed today.  Emphasized the importance of home blood pressure monitoring, encouraged to log for my review next time.  Reviewed the Seven S's of hypertension including salt, smoking, stimulants (e.g. caffeine), stress, sleep, snoring (OSA), sedentary lifestyle.    Poor concentration -     buPROPion HCl ER (SR); Take 1 tablet (150 mg total) by mouth daily.  Dispense: 30 tablet; Refill: 1  History of adenomatous polyp of colon Assessment & Plan: Overdue for repeat colonoscopy, submitting new referral today for Dr. Ole Berkeley  Orders: -     Ambulatory referral to Gastroenterology  Elevated ferritin Assessment & Plan: Requested patient send his labs to us  through MyChart for abstraction.  Probably acute phase reactant in response to his bilateral knee arthritis, which was flaring up at the time of lab  draw.  Patient advised I am happy to recheck this at any time, including today.  Will defer for a later visit.   Bilateral chronic knee pain Assessment & Plan: Proceed with PT.  Try adding topical  diclofenac.  Orders: -     Diclofenac Sodium; Apply 2 g topically 4 (four) times daily. Use on affected joint up to 4x/day as needed. 2 grams is roughly 4 fingertips' worth of gel.  Dispense: 100 g; Refill: 1  Long-term current use of injectable noninsulin antidiabetic medication Assessment & Plan: Discouraged use of GLP-1 RA in this patient who is not diabetic and has room for improvement in lifestyle measures when it comes to class I obesity and HTN.  However, as I am not the one prescribing this medication, this would be a conversation for him to have with the prescriber      Return in about 4 weeks (around 10/10/2023) for OV f/u chronic conditions.    Cody Das, PA-C, DMSc, Nutritionist [facility] [phone]

## 2023-09-12 NOTE — Patient Instructions (Signed)
 Reviewed the "Seven S's" of hypertension including salt, smoking, stimulants (e.g. caffeine), stress, sleep, snoring (OSA), sedentary lifestyle.

## 2023-09-12 NOTE — Assessment & Plan Note (Signed)
 Requested patient send his labs to us  through MyChart for abstraction.  Probably acute phase reactant in response to his bilateral knee arthritis, which was flaring up at the time of lab draw.  Patient advised I am happy to recheck this at any time, including today.  Will defer for a later visit.

## 2023-09-12 NOTE — Assessment & Plan Note (Signed)
 Overdue for repeat colonoscopy, submitting new referral today for Dr. Ole Berkeley

## 2023-09-12 NOTE — Assessment & Plan Note (Signed)
 Proceed with PT.  Try adding topical diclofenac.

## 2023-09-12 NOTE — Assessment & Plan Note (Signed)
 Discouraged use of GLP-1 RA in this patient who is not diabetic and has room for improvement in lifestyle measures when it comes to class I obesity and HTN.  However, as I am not the one prescribing this medication, this would be a conversation for him to have with the prescriber

## 2023-10-04 DIAGNOSIS — M7652 Patellar tendinitis, left knee: Secondary | ICD-10-CM | POA: Diagnosis not present

## 2023-10-18 ENCOUNTER — Ambulatory Visit: Admitting: Physician Assistant

## 2023-10-31 DIAGNOSIS — M25662 Stiffness of left knee, not elsewhere classified: Secondary | ICD-10-CM | POA: Diagnosis not present

## 2023-10-31 DIAGNOSIS — R262 Difficulty in walking, not elsewhere classified: Secondary | ICD-10-CM | POA: Diagnosis not present

## 2023-10-31 DIAGNOSIS — M25562 Pain in left knee: Secondary | ICD-10-CM | POA: Diagnosis not present

## 2023-11-13 ENCOUNTER — Other Ambulatory Visit: Payer: Self-pay | Admitting: Physician Assistant

## 2023-11-13 DIAGNOSIS — R41840 Attention and concentration deficit: Secondary | ICD-10-CM

## 2023-11-13 DIAGNOSIS — M7652 Patellar tendinitis, left knee: Secondary | ICD-10-CM | POA: Diagnosis not present

## 2023-11-15 DIAGNOSIS — M1712 Unilateral primary osteoarthritis, left knee: Secondary | ICD-10-CM | POA: Diagnosis not present

## 2023-11-16 NOTE — Telephone Encounter (Signed)
 Requested medications are due for refill today.  yes  Requested medications are on the active medications list.  yes  Last refill. 09/12/2023 #30 1 rf  Future visit scheduled.   no  Notes to clinic.  New medication to this pt.    Requested Prescriptions  Pending Prescriptions Disp Refills   buPROPion  (WELLBUTRIN  SR) 150 MG 12 hr tablet [Pharmacy Med Name: BUPROPION  HYDROCHLORIDE ER (SR) 150MG  SR TABLET ER 12HR] 30 tablet 1    Sig: TAKE (1) TABLET BY MOUTH EVERY DAY     Psychiatry: Antidepressants - bupropion  Failed - 11/16/2023 11:25 AM      Failed - ALT in normal range and within 360 days    ALT  Date Value Ref Range Status  08/02/2023 48 (H) 0 - 44 IU/L Final         Passed - Cr in normal range and within 360 days    Creatinine, Ser  Date Value Ref Range Status  08/02/2023 1.02 0.76 - 1.27 mg/dL Final         Passed - AST in normal range and within 360 days    AST  Date Value Ref Range Status  08/02/2023 32 0 - 40 IU/L Final         Passed - Last BP in normal range    BP Readings from Last 1 Encounters:  09/12/23 130/80         Passed - Valid encounter within last 6 months    Recent Outpatient Visits           2 months ago Primary hypertension   [place] [facility] at [facility], [place], [place]   3 months ago Annual physical exam   [facility] at [facility] Cathryne JAYSON, [place]

## 2023-11-22 ENCOUNTER — Ambulatory Visit: Admitting: Family Medicine

## 2023-11-22 ENCOUNTER — Ambulatory Visit: Admitting: Physician Assistant

## 2023-11-22 ENCOUNTER — Ambulatory Visit: Payer: Self-pay

## 2023-11-22 ENCOUNTER — Encounter: Payer: Self-pay | Admitting: Family Medicine

## 2023-11-22 VITALS — BP 150/90 | HR 95 | Resp 16 | Ht 68.0 in | Wt 215.0 lb

## 2023-11-22 DIAGNOSIS — Z1349 Encounter for screening for other developmental delays: Secondary | ICD-10-CM | POA: Diagnosis not present

## 2023-11-22 DIAGNOSIS — N342 Other urethritis: Secondary | ICD-10-CM

## 2023-11-22 LAB — POCT URINALYSIS DIP (CLINITEK)
Bilirubin, UA: NEGATIVE
Glucose, UA: NEGATIVE mg/dL
Ketones, POC UA: NEGATIVE mg/dL
Leukocytes, UA: NEGATIVE
Nitrite, UA: NEGATIVE
POC PROTEIN,UA: NEGATIVE
Spec Grav, UA: 1.025 (ref 1.010–1.025)
Urobilinogen, UA: 0.2 U/dL
pH, UA: 5.5 (ref 5.0–8.0)

## 2023-11-22 MED ORDER — DOXYCYCLINE HYCLATE 100 MG PO TABS
100.0000 mg | ORAL_TABLET | Freq: Two times a day (BID) | ORAL | 0 refills | Status: AC
Start: 2023-11-22 — End: 2023-11-29

## 2023-11-22 MED ORDER — FLUCONAZOLE 150 MG PO TABS
ORAL_TABLET | ORAL | 0 refills | Status: AC
Start: 2023-11-22 — End: ?

## 2023-11-22 NOTE — Progress Notes (Signed)
 Acute Care Office Visit  Subjective:   Johnny Liu 10/11/1965 11/22/2023  Chief Complaint  Patient presents with   Establish Care    TOC    HPI: Discussed the use of AI scribe software for clinical note transcription with the patient, who gave verbal consent to proceed.  History of Present Illness   Johnny Liu is a 58 year old male who presents with recurrent urethral discomfort and irritation.  He has been experiencing urethral discomfort and irritation, described as soreness and burning at the lower end of his penis, with a severity of 4 out of 10. These symptoms have persisted for over a week, beginning around last Tuesday or Wednesday. The tip of his penis appears red and irritated, particularly where it rubs against clothing.  He has a history of similar episodes approximately twice a year since 2019, which have been effectively treated with doxycycline  followed by diflucan . No significant abdominal or lower abdomen pain is present, but he mentions mild groin discomfort. No back pain or kidney pain is reported.  His urine has appeared slightly cloudy but not dark, without any foul smell. He does not anticipate any sexually transmitted infections, although doxycycline  has been used to address such concerns in the past.   The following portions of the patient's history were reviewed and updated as appropriate: past medical history, past surgical history, family history, social history, allergies, medications, and problem list.   Patient Active Problem List   Diagnosis Date Noted   History of skin cancer 09/12/2023   Elevated ferritin 09/12/2023   Bilateral chronic knee pain 09/12/2023   Long-term current use of injectable noninsulin antidiabetic medication 09/12/2023   Primary hypertension 09/12/2023   Intervertebral cervical disc disorder with myelopathy, cervical region 11/29/2022   Cervicalgia 11/15/2022   Cervical radiculopathy 11/15/2022   Heartburn     History of esophageal stricture    History of adenomatous polyp of colon    Hypertrophy of prostate with urinary obstruction and other lower urinary tract symptoms (LUTS) 09/23/2012   ED (erectile dysfunction) of organic origin 09/23/2012   Past Medical History:  Diagnosis Date   Anxiety    Arthritis    Cervical radiculopathy    Chronic prostatitis    COVID-19    ED (erectile dysfunction) of organic origin    GERD (gastroesophageal reflux disease)    Hypertension    Hypertrophy of prostate with urinary obstruction    Intervertebral cervical disc disorder with myelopathy, cervical region    Problems with swallowing and mastication    Stricture and stenosis of esophagus    Past Surgical History:  Procedure Laterality Date   APPENDECTOMY     COLONOSCOPY WITH PROPOFOL  N/A 08/31/2016   Procedure: COLONOSCOPY WITH PROPOFOL ;  Surgeon: Jinny Carmine, MD;  Location: [facility];  Service: Endoscopy;  Laterality: N/A;   ESOPHAGEAL DILATION  08/31/2016   Procedure: ESOPHAGEAL DILATION;  Surgeon: Jinny Carmine, MD;  Location: [facility];  Service: Endoscopy;;   ESOPHAGOGASTRODUODENOSCOPY (EGD) WITH PROPOFOL  N/A 08/31/2016   Procedure: ESOPHAGOGASTRODUODENOSCOPY (EGD) WITH PROPOFOL ;  Surgeon: Jinny Carmine, MD;  Location: [facility];  Service: Endoscopy;  Laterality: N/A;   POLYPECTOMY  2018   POSTERIOR CERVICAL LAMINECTOMY Left 12/21/2022   Procedure: C5-6 POSTERIOR CERVICAL DISCECTOMY;  Surgeon: Claudene Penne ORN, MD;  Location: [facility];  Service: Neurosurgery;  Laterality: Left;   Family History  Problem Relation Age of Onset   Healthy Mother    Healthy Father  Outpatient Medications Prior to Visit  Medication Sig Dispense Refill   acetaminophen  (TYLENOL ) 500 MG tablet Take 1,000 mg by mouth every 6 (six) hours as needed.     albuterol  (VENTOLIN  HFA) 108 (90 Base) MCG/ACT inhaler Inhale 2 puffs into the lungs every 6 (six) hours as needed for wheezing or  shortness of breath. 8 g 2   buPROPion  (WELLBUTRIN  SR) 150 MG 12 hr tablet TAKE (1) TABLET BY MOUTH EVERY DAY 14 tablet 0   diclofenac  Sodium (VOLTAREN ) 1 % GEL Apply 2 g topically 4 (four) times daily. Use on affected joint up to 4x/day as needed. 2 grams is roughly 4 fingertips' worth of gel. 100 g 1   ibuprofen  (ADVIL ) 200 MG tablet Take 200 mg by mouth every 6 (six) hours as needed.     loratadine (CLARITIN) 10 MG tablet Take 10 mg by mouth daily as needed.     losartan  (COZAAR ) 25 MG tablet Take 1 tablet (25 mg total) by mouth daily. 90 tablet 1   MAGNESIUM PO Take by mouth.     meloxicam  (MOBIC ) 15 MG tablet Take 1 tablet (15 mg total) by mouth daily. 30 tablet 2   montelukast  (SINGULAIR ) 10 MG tablet Take 1 tablet (10 mg total) by mouth at bedtime. 30 tablet 3   Multiple Vitamin (MULTIVITAMIN) tablet Take 1 tablet by mouth daily.     Needle, Disp, (HYPODERMIC NEEDLE 18GX1) 18G X 1 MISC Use as directed for testosterone   withdrawal     sildenafil  (VIAGRA ) 50 MG tablet Take 1 tablet (50 mg total) by mouth daily as needed for erectile dysfunction. 10 tablet 11   SYRINGE-NEEDLE, DISP, 3 ML (B-D 3CC LUER-LOK SYR 23GX1-1/2) 23G X 1-1/2 3 ML MISC Use as directed for testosterone  injections     tadalafil (CIALIS) 5 MG tablet Take 5 mg by mouth as needed.     testosterone  cypionate (DEPOTESTOSTERONE CYPIONATE) 200 MG/ML injection Inject into the muscle. urology     VITAMIN D  PO Take by mouth.     No facility-administered medications prior to visit.   No Known Allergies   ROS: A complete ROS was performed with pertinent positives/negatives noted in the HPI. The remainder of the ROS are negative.    Objective:   Today's Vitals   11/22/23 1335  BP: (!) 150/90  Pulse: 95  Resp: 16  SpO2: 99%  Weight: 215 lb (97.5 kg)  Height: 5' 8 (1.727 m)  PainSc: 0-No pain    GENERAL: Well-appearing, in NAD. Well nourished.  SKIN: Pink, warm and dry. No rash, lesion, ulceration, or ecchymoses.   Head: Normocephalic. NECK: Trachea midline. Full ROM w/o pain or tenderness. No lymphadenopathy.  EARS: Tympanic membranes are intact, translucent without bulging and without drainage. Appropriate landmarks visualized.  EYES: Conjunctiva clear without exudates. EOMI, PERRL, no drainage present.  NOSE: Septum midline w/o deformity. Nares patent, mucosa pink and non-inflamed w/o drainage. No sinus tenderness.  THROAT: Uvula midline. Oropharynx clear. Tonsils non-inflamed without exudate. Mucous membranes pink and moist.  RESPIRATORY: Chest wall symmetrical. Respirations even and non-labored. Breath sounds clear to auscultation bilaterally.  CARDIAC: S1, S2 present, regular rate and rhythm without murmur or gallops. Peripheral pulses 2+ bilaterally.  MSK: Muscle tone and strength appropriate for age. Joints w/o tenderness, redness, or swelling. GI: Soft, non-tender. No CVA tenderness present.   EXTREMITIES: Without clubbing, cyanosis, or edema.  NEUROLOGIC: No motor or sensory deficits. Steady, even gait. C2-C12 intact.  PSYCH/MENTAL STATUS: Alert, oriented x 3. Cooperative,  appropriate mood and affect.      Assessment & Plan:   1. Urethritis (Primary) Chronic urethritis with burning and irritation, possible candidal balanitis due to erythema and yeast involvement. Differential includes STIs. - Order urinalysis for UTI and STIs. - Prescribe doxycycline  100 mg BID for 7 days. Advise taking doxycycline  with food. - Prescribe fluconazole  150 mg, one tablet post-doxycycline , second tablet 72 hours later. - Will update patient with urine results when they return.  - doxycycline  (VIBRA -TABS) 100 MG tablet; Take 1 tablet (100 mg total) by mouth 2 (two) times daily for 7 days.  Dispense: 14 tablet; Refill: 0 - fluconazole  (DIFLUCAN ) 150 MG tablet; Take 1 tablet (150mg ) after completing antibiotic (doxycycline ). Take second dose, 1 tablet (150mg ) after 72 hours.  Dispense: 2 tablet; Refill: 0 - Ct,  Ng, Mycoplasmas NAA, Urine - Urine Culture    Meds ordered this encounter  Medications   doxycycline  (VIBRA -TABS) 100 MG tablet    Sig: Take 1 tablet (100 mg total) by mouth 2 (two) times daily for 7 days.    Dispense:  14 tablet    Refill:  0    Supervising Provider:   ZIGLAR, SUSAN K [[id number]]   fluconazole  (DIFLUCAN ) 150 MG tablet    Sig: Take 1 tablet (150mg ) after completing antibiotic (doxycycline ). Take second dose, 1 tablet (150mg ) after 72 hours.    Dispense:  2 tablet    Refill:  0    Supervising Provider:   ZIGLAR, SUSAN K [[id number]]   Lab Orders         Urine Culture         Ct, Ng, Mycoplasmas NAA, Urine      Return if symptoms worsen or fail to improve.   Patient to reach out to office if new, worrisome, or unresolved symptoms arise or if no improvement in patient's condition. Patient verbalized understanding and is agreeable to treatment plan. All questions answered to patient's satisfaction.    Evalene Arts, FNP

## 2023-11-22 NOTE — Addendum Note (Signed)
 Addended by: Velecia Ovitt A on: 11/22/2023 04:07 PM   Modules accepted: Orders

## 2023-11-22 NOTE — Telephone Encounter (Signed)
 Pt states that he is all set states that he has already talked to someone at the office and doesn't need anything else.  Reason for Disposition . Caller has already spoken with the doctor (or NP/PA, pharmacist) and has no further questions.  Protocols used: No Contact or Duplicate Contact Call-A-AH

## 2023-11-22 NOTE — Telephone Encounter (Signed)
  Pt hung up before specialist transferred, over, NT attempted to call pt back and left a vm.       Copied from CRM #[id number]. Topic: Clinical - Red Word Triage >> Nov 22, 2023 11:10 AM Turkey B wrote: Patient/ has itching burning and problem urinating, says it's a uti

## 2023-11-24 LAB — CT, NG, MYCOPLASMAS NAA, URINE
Chlamydia trachomatis, NAA: NEGATIVE
Mycoplasma genitalium NAA: NEGATIVE
Mycoplasma hominis NAA: NEGATIVE
Neisseria gonorrhoeae, NAA: NEGATIVE
Ureaplasma spp NAA: NEGATIVE

## 2023-11-24 LAB — URINE CULTURE: Organism ID, Bacteria: NO GROWTH

## 2023-11-26 ENCOUNTER — Ambulatory Visit: Payer: Self-pay | Admitting: Family Medicine

## 2023-11-28 ENCOUNTER — Other Ambulatory Visit: Payer: Self-pay | Admitting: Physician Assistant

## 2023-11-28 DIAGNOSIS — R41840 Attention and concentration deficit: Secondary | ICD-10-CM

## 2023-11-30 ENCOUNTER — Other Ambulatory Visit: Payer: Self-pay | Admitting: Physician Assistant

## 2023-11-30 DIAGNOSIS — R41840 Attention and concentration deficit: Secondary | ICD-10-CM

## 2023-12-03 NOTE — Telephone Encounter (Signed)
 PCP not listed as primary - change in provider Requested Prescriptions  Pending Prescriptions Disp Refills   buPROPion  (WELLBUTRIN  SR) 150 MG 12 hr tablet [Pharmacy Med Name: BUPROPION  HYDROCHLORIDE ER (SR) 150MG  SR TABLET ER 12HR] 30 tablet 1    Sig: TAKE (1) TABLET BY MOUTH EVERY DAY     Psychiatry: Antidepressants - bupropion  Failed - 12/03/2023  7:00 AM      Failed - ALT in normal range and within 360 days    ALT  Date Value Ref Range Status  08/02/2023 48 (H) 0 - 44 IU/L Final         Failed - Last BP in normal range    BP Readings from Last 1 Encounters:  11/22/23 (!) 150/90         Passed - Cr in normal range and within 360 days    Creatinine, Ser  Date Value Ref Range Status  08/02/2023 1.02 0.76 - 1.27 mg/dL Final         Passed - AST in normal range and within 360 days    AST  Date Value Ref Range Status  08/02/2023 32 0 - 40 IU/L Final         Passed - Valid encounter within last 6 months    Recent Outpatient Visits           2 months ago Primary hypertension   [facility] at [facility], [place], [place]   4 months ago Annual physical exam   [facility] at [facility] Cathryne JAYSON, [place]

## 2023-12-13 DIAGNOSIS — M1712 Unilateral primary osteoarthritis, left knee: Secondary | ICD-10-CM | POA: Diagnosis not present

## 2024-01-03 DIAGNOSIS — M1712 Unilateral primary osteoarthritis, left knee: Secondary | ICD-10-CM | POA: Diagnosis not present

## 2024-03-06 DIAGNOSIS — M1712 Unilateral primary osteoarthritis, left knee: Secondary | ICD-10-CM | POA: Diagnosis not present

## 2024-05-08 ENCOUNTER — Ambulatory Visit

## 2024-05-08 DIAGNOSIS — L814 Other melanin hyperpigmentation: Secondary | ICD-10-CM

## 2024-05-08 DIAGNOSIS — L719 Rosacea, unspecified: Secondary | ICD-10-CM

## 2024-05-08 DIAGNOSIS — L820 Inflamed seborrheic keratosis: Secondary | ICD-10-CM

## 2024-05-08 DIAGNOSIS — L578 Other skin changes due to chronic exposure to nonionizing radiation: Secondary | ICD-10-CM

## 2024-05-08 DIAGNOSIS — L821 Other seborrheic keratosis: Secondary | ICD-10-CM

## 2024-05-08 DIAGNOSIS — D489 Neoplasm of uncertain behavior, unspecified: Secondary | ICD-10-CM

## 2024-05-08 DIAGNOSIS — Z1283 Encounter for screening for malignant neoplasm of skin: Secondary | ICD-10-CM

## 2024-05-08 DIAGNOSIS — C4490 Unspecified malignant neoplasm of skin, unspecified: Secondary | ICD-10-CM

## 2024-05-08 DIAGNOSIS — D1801 Hemangioma of skin and subcutaneous tissue: Secondary | ICD-10-CM

## 2024-05-08 DIAGNOSIS — L570 Actinic keratosis: Secondary | ICD-10-CM

## 2024-05-08 DIAGNOSIS — D229 Melanocytic nevi, unspecified: Secondary | ICD-10-CM

## 2024-05-08 MED ORDER — TRIAMCINOLONE ACETONIDE 0.1 % EX OINT: TOPICAL_OINTMENT | CUTANEOUS | 1 refills | Status: AC

## 2024-05-08 MED ORDER — METRONIDAZOLE 1 % EX GEL: Freq: Every day | CUTANEOUS | 1 refills | Status: AC

## 2024-05-08 NOTE — Patient Instructions (Signed)

## 2024-05-08 NOTE — Progress Notes (Signed)
 "   Subjective   Johnny Liu is a 59 y.o. male who presents for the following: Total body skin exam for skin cancer screening and mole check. The patient has spots, moles and lesions to be evaluated, some may be new or changing and the patient may have concern these could be cancer.. Patient is new patient  Today patient reports: Rash on the back x2 weeks that is itchy but not painful.   Review of Systems:    No other skin or systemic complaints except as noted in HPI or Assessment and Plan.  The following portions of the chart were reviewed this encounter and updated as appropriate: medications, allergies, medical history  Relevant Medical History:  Personal history of non melanoma skin cancer - see medical history for full details   Objective  (SKPE) Well appearing patient in no apparent distress; mood and affect are within normal limits. Examination was performed of the: Full Skin Examination: scalp, head, eyes, ears, nose, lips, neck, chest, axillae, abdomen, back, buttocks, bilateral upper extremities, bilateral lower extremities, hands, feet, fingers, toes, fingernails, and toenails.   Examination notable for: SKIN EXAM, Angioma(s): Scattered red vascular papule(s)  , Lentigo/lentigines: Scattered pigmented macules that are tan to brown in color and are somewhat non-uniform in shape and concentrated in the sun-exposed areas, Nevus/nevi: Scattered well-demarcated, regular, pigmented macule(s) and/or papule(s)  , Seborrheic Keratosis(es): Stuck-on appearing keratotic papule(s) on the trunk, none  irritated with redness, crusting, edema, and/or partial avulsion, Actinic Damage/Elastosis: chronic sun damage: dyspigmentation, telangiectasia, and wrinkling, Actinic keratosis: Scaly erythematous macule(s) concentrated on sun exposed areas  Crusted eczematous papules of lower back   Examination limited by: Undergarments and Patient deferred removal     Right Temple 1.4x1.2cm erythematous  patch   Face and ears x6 (6) Pink scaly macules Right thigh Stuck on waxy paps with erythema  Assessment & Plan  (SKAP)   SKIN CANCER SCREENING PERFORMED TODAY.  BENIGN SKIN FINDINGS  - Lentigines  - Seborrheic keratoses  - Hemangiomas   - Nevus/Multiple Benign Nevi  - Skin tags bilateral axilla  - Reassurance provided regarding the benign appearance of lesions noted on exam today; no treatment is indicated in the absence of symptoms/changes. - Reinforced importance of photoprotective strategies including liberal and frequent sunscreen use of a broad-spectrum SPF 30 or greater, use of protective clothing, and sun avoidance for prevention of cutaneous malignancy and photoaging.  Counseled patient on the importance of regular self-skin monitoring as well as routine clinical skin examinations as scheduled.   ACTINIC DAMAGE - Chronic condition, secondary to cumulative UV/sun exposure - Recommend daily broad spectrum sunscreen SPF 30+ to sun-exposed areas, reapply every 2 hours as needed.  - Staying in the shade or wearing long sleeves, sun glasses (UVA+UVB protection) and wide brim hats (4-inch brim around the entire circumference of the hat) are also recommended for sun protection.  - Call for new or changing lesions.  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  Notes two other [facility] - Will request records from Dr. 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  - Start triamcinolone  ointment 0.1% twice daily to affected areas of skin Discussed side effect of potent  topical steroids including atrophy, dyspigmentation, striae, telangectasia, folliculitis, loss of skin pigment, hair growth, tachyphylaxis, risk of systemic absorption with missuse.  Rosacea Chronic and persistent condition with duration or expected duration over one year. Condition is symptomatic and bothersome to patient. Patient is flaring and not currently at treatment goal.   - Reviewed etiology and probable recurrent nature of this condition - Discussed potential triggers including caffeine, heat, sun exposure, chocolate, etc. and recommended patient attempt to identify and avoid triggers - Start metronidazole  0.75% gel BID to affected areas of face - Sun avoidance, protective clothing sunscreen discussed - Telangiectasias will likely not fade completely, laser removal may be considered as a cosmetic procedure    Was sun protection counseling provided?: Yes   Level of service outlined above   Patient instructions (SKPI)   Procedures, orders, diagnosis for this visit:  NEOPLASM OF UNCERTAIN BEHAVIOR Right Temple - Skin / nail biopsy Type of biopsy: tangential   Informed consent: discussed and consent obtained   Timeout: patient name, date of birth, surgical site, and procedure verified   Procedure prep:  Patient was prepped and draped in usual sterile fashion Prep type:  Isopropyl alcohol Anesthesia: the lesion was anesthetized in a standard fashion   Anesthetic:  1% lidocaine  w/ epinephrine  1-100,000 buffered w/ 8.4% NaHCO3 Instrument used: DermaBlade   Hemostasis achieved with: pressure and aluminum chloride   Outcome: patient tolerated procedure well   Post-procedure details: sterile dressing applied and wound care instructions given   Dressing type: bandage and petrolatum    Specimen 1 - Surgical pathology Differential Diagnosis: h/o BCC excised in 2023 w/+ margin   Check Margins: No ACTINIC KERATOSIS (6) Face and ears x6 (6) Actinic keratoses are precancerous spots  that appear secondary to cumulative UV radiation exposure/sun exposure over time. They are chronic with expected duration over 1 year. A portion of actinic keratoses will progress to squamous cell carcinoma of the skin. It is not possible to reliably predict which spots will progress to skin cancer and so treatment is recommended to prevent development of skin cancer.  Recommend daily broad spectrum sunscreen SPF 30+ to sun-exposed areas, reapply every 2 hours as needed.  Recommend staying in the shade or wearing long sleeves, sun glasses (UVA+UVB protection) and wide brim hats (4-inch brim around the entire circumference of the hat). Call for new or changing lesions. - Destruction of lesion - Face and ears x6 (6) Complexity: simple   Destruction method: cryotherapy   Informed consent: discussed and consent obtained   Timeout:  patient name, date of birth, surgical site, and procedure verified Lesion destroyed using liquid nitrogen: Yes   Region frozen until ice ball extended beyond lesion: Yes   Cryo cycles: 1 or 2. Outcome: patient tolerated procedure well with no complications   Post-procedure details: wound care instructions given    INFLAMED SEBORRHEIC KERATOSIS Right thigh Symptomatic, irritating, patient would like treated. - Destruction of lesion - Right thigh Complexity: simple   Destruction method: cryotherapy   Informed consent: discussed and consent obtained   Timeout:  patient name, date of birth, surgical site, and procedure verified Lesion destroyed using liquid nitrogen: Yes   Region frozen until ice ball extended beyond lesion: Yes   Cryo cycles: 1 or 2. Outcome: patient tolerated procedure well with no complications   Post-procedure details: wound care instructions given  Neoplasm of uncertain behavior -     Skin / nail biopsy -     Surgical pathology; Standing  Actinic keratosis -     Destruction of lesion  Inflamed seborrheic keratosis -     Destruction  of lesion  Other orders -     Triamcinolone  Acetonide; Apply 7 grams twice daily to affected areas of skin. Stop once resolved and restart as needed for flares. Avoid use on face, armpits, groin unless otherwise indicated.  Dispense: 454 g; Refill: 1    Return to clinic: Return in about 6 months (around 11/05/2024) for TBSE.  I, Emerick Ege, CMA am acting as scribe for Lauraine JAYSON Kanaris, MD.  Documentation: I have reviewed the above documentation for accuracy and completeness, and I agree with the above.  Lauraine JAYSON Kanaris, MD  "

## 2024-05-09 LAB — SURGICAL PATHOLOGY

## 2024-11-06 ENCOUNTER — Ambulatory Visit
